# Patient Record
Sex: Female | Born: 1960 | Race: White | Hispanic: No | Marital: Married | Smoking: Current every day smoker
Health system: Southern US, Community
[De-identification: ages and names within clinical notes are randomized; demographics above are authoritative.]

## PROBLEM LIST (undated history)

## (undated) DIAGNOSIS — I10 Essential (primary) hypertension: Secondary | ICD-10-CM

## (undated) DIAGNOSIS — N200 Calculus of kidney: Secondary | ICD-10-CM

## (undated) DIAGNOSIS — R920 Mammographic microcalcification found on diagnostic imaging of breast: Secondary | ICD-10-CM

## (undated) DIAGNOSIS — D0590 Unspecified type of carcinoma in situ of unspecified breast: Secondary | ICD-10-CM

## (undated) DIAGNOSIS — B019 Varicella without complication: Secondary | ICD-10-CM

## (undated) DIAGNOSIS — Z923 Personal history of irradiation: Secondary | ICD-10-CM

## (undated) DIAGNOSIS — Z3200 Encounter for pregnancy test, result unknown: Secondary | ICD-10-CM

## (undated) DIAGNOSIS — B029 Zoster without complications: Secondary | ICD-10-CM

## (undated) HISTORY — DX: Zoster without complications: B02.9

## (undated) HISTORY — DX: Unspecified type of carcinoma in situ of unspecified breast: D05.90

## (undated) HISTORY — DX: Calculus of kidney: N20.0

## (undated) HISTORY — DX: Varicella without complication: B01.9

## (undated) HISTORY — DX: Essential (primary) hypertension: I10

## (undated) HISTORY — DX: Encounter for pregnancy test, result unknown: Z32.00

## (undated) HISTORY — DX: Mammographic microcalcification found on diagnostic imaging of breast: R92.0

---

## 1986-08-30 HISTORY — PX: LAPAROSCOPY: SHX197

## 1990-08-30 HISTORY — PX: LITHOTRIPSY: SUR834

## 2005-08-30 HISTORY — PX: KNEE SURGERY: SHX244

## 2008-07-03 ENCOUNTER — Ambulatory Visit: Payer: Self-pay | Admitting: Unknown Physician Specialty

## 2009-07-16 ENCOUNTER — Ambulatory Visit: Payer: Self-pay | Admitting: Unknown Physician Specialty

## 2011-05-26 ENCOUNTER — Ambulatory Visit: Payer: Self-pay | Admitting: Unknown Physician Specialty

## 2011-05-31 ENCOUNTER — Ambulatory Visit: Payer: Self-pay | Admitting: Unknown Physician Specialty

## 2011-08-31 DIAGNOSIS — N200 Calculus of kidney: Secondary | ICD-10-CM

## 2011-08-31 DIAGNOSIS — R920 Mammographic microcalcification found on diagnostic imaging of breast: Secondary | ICD-10-CM

## 2011-08-31 HISTORY — DX: Mammographic microcalcification found on diagnostic imaging of breast: R92.0

## 2011-08-31 HISTORY — DX: Calculus of kidney: N20.0

## 2012-08-04 ENCOUNTER — Ambulatory Visit: Payer: Self-pay

## 2012-08-30 DIAGNOSIS — Z3200 Encounter for pregnancy test, result unknown: Secondary | ICD-10-CM

## 2012-08-30 DIAGNOSIS — Z923 Personal history of irradiation: Secondary | ICD-10-CM

## 2012-08-30 DIAGNOSIS — D0590 Unspecified type of carcinoma in situ of unspecified breast: Secondary | ICD-10-CM

## 2012-08-30 HISTORY — DX: Personal history of irradiation: Z92.3

## 2012-08-30 HISTORY — PX: BREAST LUMPECTOMY: SHX2

## 2012-08-30 HISTORY — DX: Unspecified type of carcinoma in situ of unspecified breast: D05.90

## 2012-08-30 HISTORY — DX: Encounter for pregnancy test, result unknown: Z32.00

## 2012-09-04 ENCOUNTER — Ambulatory Visit: Payer: Self-pay | Admitting: General Surgery

## 2012-09-13 ENCOUNTER — Ambulatory Visit: Payer: Self-pay | Admitting: General Surgery

## 2012-09-13 HISTORY — PX: BREAST LUMPECTOMY WITH SENTINEL LYMPH NODE BIOPSY: SHX5597

## 2012-09-13 HISTORY — PX: BREAST EXCISIONAL BIOPSY: SUR124

## 2012-09-15 LAB — PATHOLOGY REPORT

## 2012-09-28 ENCOUNTER — Ambulatory Visit: Payer: Self-pay | Admitting: Radiation Oncology

## 2012-09-30 ENCOUNTER — Ambulatory Visit: Payer: Self-pay | Admitting: Radiation Oncology

## 2012-10-13 ENCOUNTER — Ambulatory Visit: Payer: Self-pay | Admitting: Radiation Oncology

## 2012-10-28 ENCOUNTER — Ambulatory Visit: Payer: Self-pay | Admitting: Radiation Oncology

## 2012-11-02 LAB — CBC CANCER CENTER
Basophil %: 0.6 %
Eosinophil #: 0.1 x10 3/mm (ref 0.0–0.7)
HGB: 15.5 g/dL (ref 12.0–16.0)
Lymphocyte #: 1 x10 3/mm (ref 1.0–3.6)
Lymphocyte %: 11.5 %
MCH: 34.1 pg — ABNORMAL HIGH (ref 26.0–34.0)
MCV: 99 fL (ref 80–100)
Monocyte %: 6.8 %
Neutrophil %: 80.2 %
RBC: 4.56 10*6/uL (ref 3.80–5.20)
WBC: 9 x10 3/mm (ref 3.6–11.0)

## 2012-11-09 LAB — CBC CANCER CENTER
Basophil #: 0 x10 3/mm (ref 0.0–0.1)
Eosinophil #: 0 x10 3/mm (ref 0.0–0.7)
Eosinophil %: 0.3 %
HCT: 45.5 % (ref 35.0–47.0)
HGB: 15.8 g/dL (ref 12.0–16.0)
Lymphocyte %: 10.6 %
MCHC: 34.8 g/dL (ref 32.0–36.0)
MCV: 99 fL (ref 80–100)
Monocyte #: 0.7 x10 3/mm (ref 0.2–0.9)
Monocyte %: 7.7 %
Neutrophil #: 7.2 x10 3/mm — ABNORMAL HIGH (ref 1.4–6.5)
Neutrophil %: 81 %
RDW: 12.6 % (ref 11.5–14.5)

## 2012-11-16 LAB — CBC CANCER CENTER
Basophil %: 0.5 %
HCT: 46 % (ref 35.0–47.0)
HGB: 15.7 g/dL (ref 12.0–16.0)
MCV: 99 fL (ref 80–100)
RBC: 4.66 10*6/uL (ref 3.80–5.20)
RDW: 12.5 % (ref 11.5–14.5)
WBC: 7.8 x10 3/mm (ref 3.6–11.0)

## 2012-11-23 ENCOUNTER — Encounter: Payer: Self-pay | Admitting: *Deleted

## 2012-11-23 LAB — CBC CANCER CENTER
Eosinophil %: 0.5 %
HCT: 44.9 % (ref 35.0–47.0)
Lymphocyte %: 11.6 %
MCH: 34 pg (ref 26.0–34.0)
MCV: 99 fL (ref 80–100)
Monocyte #: 0.6 x10 3/mm (ref 0.2–0.9)
Neutrophil #: 5.1 x10 3/mm (ref 1.4–6.5)
Neutrophil %: 78.3 %
WBC: 6.6 x10 3/mm (ref 3.6–11.0)

## 2012-11-28 ENCOUNTER — Ambulatory Visit: Payer: Self-pay | Admitting: Radiation Oncology

## 2012-11-29 ENCOUNTER — Ambulatory Visit: Payer: Self-pay | Admitting: General Surgery

## 2012-11-30 LAB — CBC CANCER CENTER
Basophil #: 0 x10 3/mm (ref 0.0–0.1)
Basophil %: 0.4 %
Eosinophil #: 0.1 x10 3/mm (ref 0.0–0.7)
Eosinophil %: 1.1 %
HCT: 47.4 % — ABNORMAL HIGH (ref 35.0–47.0)
HGB: 16 g/dL (ref 12.0–16.0)
Lymphocyte %: 12.2 %
Lymphs Abs: 0.9 x10 3/mm — ABNORMAL LOW (ref 1.0–3.6)
MCH: 33.4 pg (ref 26.0–34.0)
MCHC: 33.7 g/dL (ref 32.0–36.0)
MCV: 99 fL (ref 80–100)
Monocyte #: 0.7 x10 3/mm (ref 0.2–0.9)
Monocyte %: 8.9 %
Neutrophil #: 5.8 x10 3/mm (ref 1.4–6.5)
Neutrophil %: 77.4 %
Platelet: 260 x10 3/mm (ref 150–440)
RBC: 4.79 x10 6/mm (ref 3.80–5.20)
RDW: 12.7 % (ref 11.5–14.5)
WBC: 7.4 x10 3/mm (ref 3.6–11.0)

## 2012-12-07 LAB — CBC CANCER CENTER
Basophil #: 0 x10 3/mm (ref 0.0–0.1)
Basophil %: 0.5 %
Eosinophil %: 0.5 %
HCT: 46 % (ref 35.0–47.0)
Lymphocyte #: 0.7 x10 3/mm — ABNORMAL LOW (ref 1.0–3.6)
Lymphocyte %: 7.2 %
MCHC: 33.5 g/dL (ref 32.0–36.0)
MCV: 100 fL (ref 80–100)
Monocyte %: 7.8 %
Neutrophil %: 84 %
Platelet: 233 x10 3/mm (ref 150–440)
RDW: 12.5 % (ref 11.5–14.5)
WBC: 9.2 x10 3/mm (ref 3.6–11.0)

## 2012-12-19 ENCOUNTER — Encounter: Payer: Self-pay | Admitting: General Surgery

## 2012-12-19 ENCOUNTER — Ambulatory Visit (INDEPENDENT_AMBULATORY_CARE_PROVIDER_SITE_OTHER): Payer: 59 | Admitting: General Surgery

## 2012-12-19 VITALS — BP 142/100 | HR 88 | Resp 16 | Ht 67.0 in | Wt 140.0 lb

## 2012-12-19 DIAGNOSIS — D0590 Unspecified type of carcinoma in situ of unspecified breast: Secondary | ICD-10-CM

## 2012-12-19 DIAGNOSIS — Z853 Personal history of malignant neoplasm of breast: Secondary | ICD-10-CM

## 2012-12-19 DIAGNOSIS — D0591 Unspecified type of carcinoma in situ of right breast: Secondary | ICD-10-CM

## 2012-12-19 MED ORDER — TAMOXIFEN CITRATE 20 MG PO TABS: 20.0000 mg | ORAL_TABLET | Freq: Every day | ORAL | Status: DC

## 2012-12-19 NOTE — Patient Instructions (Addendum)
Patient to use tamoxifen daily - e  prescription to pharmacy.   The patient has been asked to return to the office in 3 months with a unilateral right breast diagnostic mammogram.

## 2012-12-19 NOTE — Progress Notes (Signed)
   Patient ID: Jane Gonzales, female   DOB: 1960-09-24, 52 y.o.   MRN: [id number]  Chief Complaint  Patient presents with  . Follow-up    breast ca    HPI Jane Gonzales is a 52 y.o. female here today for her follow up  right breast  Lumpectomy with sentinel  nodule done 08/2012.Finish radiation on 12/14/2012  HPI  Past Medical History  Diagnosis Date  . Kidney stones 2013  . Mammographic microcalcification 2013  . 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 Pregnancy examination or test, pregnancy unconfirmed 2014    Past Surgical History  Procedure Laterality Date  . Breast lumpectomy with sentinel lymph node biopsy Right 09/13/2012  . Knee surgery  2007    ACL  . Laparoscopy  1988  . Lithotripsy  1992    History reviewed. No pertinent family history.  Social History History  Substance Use Topics  . Smoking status: Current Every Day Smoker -- 0.50 packs/day for 28 years    Types: Cigarettes  . Smokeless tobacco: Never Used  . Alcohol Use: 3.6 oz/week    6 Glasses of wine per week     Comment: beer or wine    No Known Allergies  Current Outpatient Prescriptions  Medication Sig Dispense Refill  . hydrochlorothiazide (HYDRODIURIL) 25 MG tablet Take 25 mg by mouth daily. Start on 10/02/2012 and complete on 11/30/2012      . tamoxifen (NOLVADEX) 20 MG tablet Take 1 tablet (20 mg total) by mouth daily.  30 tablet  12   No current facility-administered medications for this visit.    Review of Systems Review of Systems  Constitutional: Negative.   Respiratory: Negative.   Cardiovascular: Negative.     Blood pressure 142/100, pulse 88, resp. rate 16, height 5\' 7"  (1.702 m), weight 140 lb (63.504 kg), last menstrual period 12/17/2012.  Physical Exam Physical Exam  Constitutional: She appears well-developed.  Cardiovascular: Normal rate, regular rhythm and normal heart sounds.   Pulmonary/Chest: Effort normal  and breath sounds normal. Right breast exhibits no inverted nipple, no mass, no nipple discharge, no skin change and no tenderness. Left breast exhibits no inverted nipple, no mass, no nipple discharge, no skin change and no tenderness.  Post radiation skin changes  Lymphadenopathy:    She has no cervical adenopathy.    She has no axillary adenopathy.    Data Reviewed none  Assessment    DCIS right breast     Plan    Start on Tamoxifen. Risks and benefits explained to pt.      The patient has been asked to return to the office in 3 months for a unilateral right breast diagnostic mammogram.   Ariya Bohannon G 12/19/2012, 11:23 AM

## 2012-12-28 ENCOUNTER — Ambulatory Visit: Payer: Self-pay | Admitting: Radiation Oncology

## 2013-03-22 ENCOUNTER — Ambulatory Visit: Payer: Self-pay | Admitting: General Surgery

## 2013-03-29 ENCOUNTER — Ambulatory Visit (INDEPENDENT_AMBULATORY_CARE_PROVIDER_SITE_OTHER): Payer: 59 | Admitting: General Surgery

## 2013-03-29 ENCOUNTER — Encounter: Payer: Self-pay | Admitting: General Surgery

## 2013-03-29 VITALS — BP 130/84 | HR 86 | Resp 14 | Ht 67.0 in | Wt 134.0 lb

## 2013-03-29 DIAGNOSIS — R232 Flushing: Secondary | ICD-10-CM | POA: Insufficient documentation

## 2013-03-29 DIAGNOSIS — Z853 Personal history of malignant neoplasm of breast: Secondary | ICD-10-CM

## 2013-03-29 DIAGNOSIS — N951 Menopausal and female climacteric states: Secondary | ICD-10-CM

## 2013-03-29 MED ORDER — VENLAFAXINE HCL ER 37.5 MG PO CP24: 37.5000 mg | ORAL_CAPSULE | Freq: Every day | ORAL | Status: DC

## 2013-03-29 NOTE — Progress Notes (Signed)
Patient ID: Jane Gonzales, female   DOB: 1961-05-02, 52 y.o.   MRN: [id number]  Chief Complaint  Patient presents with  . Follow-up    mammogram    HPI Jane Gonzales is a 51 y.o. female who presents for a breast evaluation. The most recent mammogram was done on 03/22/13 cat 3. Patient does perform regular self breast checks and gets regular mammograms done. Patient is having a lot of hot flashes-from Tamoxifen  HPI  Past Medical History  Diagnosis Date  . Kidney stones 2013  . Mammographic microcalcification 2013  . 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 Pregnancy examination or test, pregnancy unconfirmed 2014    Past Surgical History  Procedure Laterality Date  . Breast lumpectomy with sentinel lymph node biopsy Right 09/13/2012  . Knee surgery  2007    ACL  . Laparoscopy  1988  . Lithotripsy  1992    History reviewed. No pertinent family history.  Social History History  Substance Use Topics  . Smoking status: Current Every Day Smoker -- 0.50 packs/day for 28 years    Types: Cigarettes  . Smokeless tobacco: Never Used  . Alcohol Use: 3.6 oz/week    6 Glasses of wine per week     Comment: beer or wine    No Known Allergies  Current Outpatient Prescriptions  Medication Sig Dispense Refill  . hydrochlorothiazide (HYDRODIURIL) 25 MG tablet Take 25 mg by mouth daily. Start on 10/02/2012 and complete on 11/30/2012      . lisinopril (PRINIVIL,ZESTRIL) 20 MG tablet Take 1 tablet by mouth daily.      . tamoxifen (NOLVADEX) 20 MG tablet Take 1 tablet (20 mg total) by mouth daily.  30 tablet  12  . venlafaxine XR (EFFEXOR XR) 37.5 MG 24 hr capsule Take 1 capsule (37.5 mg total) by mouth daily.  1 capsule  3   No current facility-administered medications for this visit.    Review of Systems Review of Systems  Constitutional: Negative.   Respiratory: Negative.   Cardiovascular: Negative.     Blood pressure  130/84, pulse 86, resp. rate 14, height 5\' 7"  (1.702 m), weight 134 lb (60.782 kg), last menstrual period 03/15/2013.  Physical Exam Physical Exam  Constitutional: She is oriented to person, place, and time. She appears well-developed and well-nourished.  Eyes: Conjunctivae are normal. No scleral icterus.  Neck: Neck supple. No mass and no thyromegaly present.  Cardiovascular: Normal rate, regular rhythm and normal heart sounds.   Pulses:      Dorsalis pedis pulses are 2+ on the right side, and 2+ on the left side.       Posterior tibial pulses are 2+ on the right side, and 2+ on the left side.  No edema, no VV  Pulmonary/Chest: Effort normal and breath sounds normal. Right breast exhibits no inverted nipple, no mass, no nipple discharge, no skin change and no tenderness. Left breast exhibits no inverted nipple, no mass, no nipple discharge, no skin change and no tenderness. Breasts are symmetrical.  Right breast lumpectomy site is well healed. Minimal skin color from radiations.   Abdominal: Soft. Bowel sounds are normal. There is no hepatosplenomegaly. There is no tenderness.  Lymphadenopathy:    She has no cervical adenopathy.    She has no axillary adenopathy.  Neurological: She is alert and oriented to person, place, and time.  Skin: Skin is warm and dry.  Data Reviewed mammogram reviewed  Assessment    Stable exam        Plan    Patient to return in six months bilateral mammograms.Dicuss use of  Effexor for control of hot flashes. Patient willing to try.       Ahnyla Mendel G 03/29/2013, 11:44 AM

## 2013-03-29 NOTE — Patient Instructions (Addendum)
Patient to return in six months with bilateral mammograms.

## 2013-03-30 ENCOUNTER — Encounter: Payer: Self-pay | Admitting: General Surgery

## 2013-04-19 ENCOUNTER — Telehealth: Payer: Self-pay

## 2013-04-19 NOTE — Telephone Encounter (Signed)
Called concerned about her period being late this month and only having some very light spotting. She has been on Tamoxifen since April.

## 2013-04-19 NOTE — Telephone Encounter (Signed)
I told her that is possible for periods to be affected.  She states that she is not worried about getting pregnant since her husband sperm count is zero.  She will keep track of her periods and monitor.  I told her I would let Dr Evette Cristal know on Monday and that if there was anything to do different I would let her know.

## 2013-06-08 ENCOUNTER — Ambulatory Visit: Payer: Self-pay | Admitting: Radiation Oncology

## 2013-06-30 ENCOUNTER — Ambulatory Visit: Payer: Self-pay | Admitting: Radiation Oncology

## 2013-07-05 ENCOUNTER — Other Ambulatory Visit: Payer: Self-pay

## 2013-09-17 ENCOUNTER — Ambulatory Visit: Payer: Self-pay | Admitting: General Surgery

## 2013-09-18 ENCOUNTER — Encounter: Payer: Self-pay | Admitting: General Surgery

## 2013-09-25 ENCOUNTER — Ambulatory Visit (INDEPENDENT_AMBULATORY_CARE_PROVIDER_SITE_OTHER): Payer: 59 | Admitting: General Surgery

## 2013-09-25 ENCOUNTER — Encounter: Payer: Self-pay | Admitting: General Surgery

## 2013-09-25 VITALS — BP 130/74 | HR 76 | Resp 14 | Ht 67.0 in | Wt 132.0 lb

## 2013-09-25 DIAGNOSIS — Z853 Personal history of malignant neoplasm of breast: Secondary | ICD-10-CM

## 2013-09-25 DIAGNOSIS — D0590 Unspecified type of carcinoma in situ of unspecified breast: Secondary | ICD-10-CM

## 2013-09-25 DIAGNOSIS — D0510 Intraductal carcinoma in situ of unspecified breast: Secondary | ICD-10-CM | POA: Insufficient documentation

## 2013-09-25 MED ORDER — TAMOXIFEN CITRATE 20 MG PO TABS: 20.0000 mg | ORAL_TABLET | Freq: Every day | ORAL | Status: DC

## 2013-09-25 NOTE — Progress Notes (Signed)
Patient ID: Jane Gonzales, female   DOB: 1960-12-20, 53 y.o.   MRN: [id number]  Chief Complaint  Patient presents with  . Follow-up    mammogram    HPI Jane Gonzales is a 53 y.o. female. who presents for a breast evaluation. The most recent mammogram was done on 09/17/13.Patient does perform regular self breast checks and gets regular mammograms done.  Patient states she is still on the Tamoxifen. She continues with some hot flashes -seems to be tolerating it ok. Not using Effexor that was prescribed last visit.  HPI  Past Medical History  Diagnosis Date  . Kidney stones 2013  . Mammographic microcalcification 2013  . 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 Pregnancy examination or test, pregnancy unconfirmed 2014    Past Surgical History  Procedure Laterality Date  . Breast lumpectomy with sentinel lymph node biopsy Right 09/13/2012  . Knee surgery  2007    ACL  . Laparoscopy  1988  . Lithotripsy  1992    History reviewed. No pertinent family history.  Social History History  Substance Use Topics  . Smoking status: Current Every Day Smoker -- 0.50 packs/day for 28 years    Types: Cigarettes  . Smokeless tobacco: Never Used  . Alcohol Use: 3.6 oz/week    6 Glasses of wine per week     Comment: beer or wine    No Known Allergies  Current Outpatient Prescriptions  Medication Sig Dispense Refill  . Ascorbic Acid (VITAMIN C) 100 MG tablet Take 100 mg by mouth daily.      Marland Kitchen lisinopril (PRINIVIL,ZESTRIL) 20 MG tablet Take 1 tablet by mouth daily.      . tamoxifen (NOLVADEX) 20 MG tablet Take 1 tablet (20 mg total) by mouth daily.  30 tablet  12  . tamoxifen (NOLVADEX) 20 MG tablet Take 1 tablet (20 mg total) by mouth daily.  90 tablet  4   No current facility-administered medications for this visit.    Review of Systems Review of Systems  Constitutional: Negative.   Respiratory: Negative.    Cardiovascular: Negative.     Blood pressure 130/74, pulse 76, resp. rate 14, height 5\' 7"  (1.702 m), weight 132 lb (59.875 kg).  Physical Exam Physical Exam  Constitutional: She is oriented to person, place, and time. She appears well-developed and well-nourished.  Eyes: Conjunctivae are normal. No scleral icterus.  Neck: Neck supple.  Cardiovascular: Normal rate, regular rhythm, normal heart sounds, intact distal pulses and normal pulses.   Pulses:      Dorsalis pedis pulses are 2+ on the right side, and 2+ on the left side.       Posterior tibial pulses are 2+ on the right side, and 2+ on the left side.  No edema, no vv  Pulmonary/Chest: Effort normal and breath sounds normal. She exhibits no swelling. Right breast exhibits no inverted nipple, no mass, no nipple discharge, no skin change and no tenderness. Left breast exhibits no inverted nipple, no mass, no nipple discharge, no skin change and no tenderness.  Right breast firmness 9 o'clock  Abdominal: Soft. Normal appearance and bowel sounds are normal. There is no hepatomegaly. There is no tenderness. No hernia.  Lymphadenopathy:    She has no cervical adenopathy.    She has no axillary adenopathy.  Neurological: She is alert and oriented to person, place, and time.  Skin: Skin is dry.  Data Reviewed Mammogram reviewed-stable post surgical changes.  Assessment    Stable exam. Righ breast DCIS, Er/PR pos. On Tamoxifen. Now completing 1 yr.    Plan    Patient to return in six months with right breast diagnotic mammogram.       [name] 09/25/2013, 10:50 AM

## 2013-09-25 NOTE — Patient Instructions (Signed)
Patient to return in six months Right breast diagnotic mammogram.

## 2013-10-10 ENCOUNTER — Encounter: Payer: Self-pay | Admitting: General Surgery

## 2013-12-05 ENCOUNTER — Ambulatory Visit: Payer: Self-pay | Admitting: Radiation Oncology

## 2013-12-18 ENCOUNTER — Telehealth: Payer: Self-pay | Admitting: *Deleted

## 2013-12-18 NOTE — Telephone Encounter (Signed)
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 Just let me know when this is ready and I will call her to pick up!

## 2013-12-19 ENCOUNTER — Encounter: Payer: Self-pay | Admitting: *Deleted

## 2013-12-20 NOTE — Telephone Encounter (Signed)
Spoke with patient and let her know that her letter was ready. She asked that it be faxed to her at [phone] and also mailed to her at her home address. Her letter has been faxed and mailed.

## 2013-12-28 ENCOUNTER — Ambulatory Visit: Payer: Self-pay | Admitting: Radiation Oncology

## 2014-01-28 DIAGNOSIS — B029 Zoster without complications: Secondary | ICD-10-CM

## 2014-01-28 HISTORY — DX: Zoster without complications: B02.9

## 2014-02-18 ENCOUNTER — Encounter: Payer: Self-pay | Admitting: Internal Medicine

## 2014-02-18 ENCOUNTER — Ambulatory Visit (INDEPENDENT_AMBULATORY_CARE_PROVIDER_SITE_OTHER): Payer: 59 | Admitting: Internal Medicine

## 2014-02-18 VITALS — BP 126/68 | HR 98 | Temp 98.3°F | Ht 64.5 in | Wt 133.0 lb

## 2014-02-18 DIAGNOSIS — I10 Essential (primary) hypertension: Secondary | ICD-10-CM | POA: Insufficient documentation

## 2014-02-18 DIAGNOSIS — B029 Zoster without complications: Secondary | ICD-10-CM

## 2014-02-18 DIAGNOSIS — Z0000 Encounter for general adult medical examination without abnormal findings: Secondary | ICD-10-CM

## 2014-02-18 MED ORDER — LISINOPRIL 20 MG PO TABS: 20.0000 mg | ORAL_TABLET | Freq: Every day | ORAL | Status: DC

## 2014-02-18 NOTE — Addendum Note (Signed)
Addended by: Alvina ChouWALSH, TERRI J on: 02/18/2014 02:12 PM   Modules accepted: Orders

## 2014-02-18 NOTE — Patient Instructions (Addendum)

## 2014-02-18 NOTE — Assessment & Plan Note (Signed)
Well controlled on lisinopril RX refilled today

## 2014-02-18 NOTE — Progress Notes (Signed)
HPI  Pt presents to the clinic today to establish care. She is transferring care from [place]. Blond[place] She has no concerns today.  Flu: 05/2013 Tetanus: more than 10 years ago LMP: postmenopausal Pap Smear: 05/2013- normal Mammogram: 08/2013- normal Eye Doctor: yearly Dentist: as needed  Past Medical History  Diagnosis Date  . Kidney stones 2013  . Mammographic microcalcification 2013  . 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 Pregnancy examination or test, pregnancy unconfirmed 2014  . Chicken pox     Current Outpatient Prescriptions  Medication Sig Dispense Refill  . Ascorbic Acid (VITAMIN C) 100 MG tablet Take 100 mg by mouth daily.      Marland Kitchen. lisinopril (PRINIVIL,ZESTRIL) 20 MG tablet Take 1 tablet by mouth daily.      . tamoxifen (NOLVADEX) 20 MG tablet Take 1 tablet (20 mg total) by mouth daily.  30 tablet  12   No current facility-administered medications for this visit.    No Known Allergies  Family History  Problem Relation Age of Onset  . Hyperlipidemia Father   . Heart disease Father   . Hypertension Father   . Stroke Paternal Grandmother     History   Social History  . Marital Status: Married    Spouse Name: N/A    Number of Children: N/A  . Years of Education: N/A   Occupational History  . Not on file.   Social History Main Topics  . Smoking status: Current Every Day Smoker -- 0.50 packs/day for 28 years    Types: Cigarettes  . Smokeless tobacco: Never Used  . Alcohol Use: 3.6 oz/week    6 Glasses of wine per week     Comment: beer or wine  . Drug Use: No  . Sexual Activity: Not on file   Other Topics Concern  . Not on file   Social History Narrative  . No narrative on file    ROS:  Constitutional: Denies fever, malaise, fatigue, headache or abrupt weight changes.  HEENT: Denies eye pain, eye redness, ear pain, ringing in the ears, wax buildup, runny nose,  nasal congestion, bloody nose, or sore throat. Respiratory: Denies difficulty breathing, shortness of breath, cough or sputum production.   Cardiovascular: Denies chest pain, chest tightness, palpitations or swelling in the hands or feet.  Gastrointestinal: Denies abdominal pain, bloating, constipation, diarrhea or blood in the stool.  GU: Denies frequency, urgency, pain with urination, blood in urine, odor or discharge. Musculoskeletal: Denies decrease in range of motion, difficulty with gait, muscle pain or joint pain and swelling.  Skin: Pt reports rash. Denies  ulcercations.  Neurological: Denies dizziness, difficulty with memory, difficulty with speech or problems with balance and coordination.   No other specific complaints in a complete review of systems (except as listed in HPI above).  PE:  BP 126/68  Pulse 98  Temp(Src) 98.3 F (36.8 C) (Oral)  Ht 5' 4.5" (1.638 m)  Wt 133 lb (60.328 kg)  BMI 22.48 kg/m2 Wt Readings from Last 3 Encounters:  02/18/14 133 lb (60.328 kg)  09/25/13 132 lb (59.875 kg)  03/29/13 134 lb (60.782 kg)    General: Appears her stated age, well developed, well nourished in NAD. Skin: Small grouped vesicular lesions, dried on right scapula/left breast. HEENT: Head: normal shape and size; Eyes: sclera white, no icterus, conjunctiva pink, PERRLA and EOMs intact; Ears: Tm's gray and intact, normal light  reflex; Nose: mucosa pink and moist, septum midline; Throat/Mouth: Teeth present, mucosa pink and moist, no lesions or ulcerations noted.  Neck: Normal range of motion. Neck supple, trachea midline. No massses, lumps or thyromegaly present.  Cardiovascular: Normal rate and rhythm. S1,S2 noted.  No murmur, rubs or gallops noted. No JVD or BLE edema. No carotid bruits noted. Pulmonary/Chest: Normal effort and positive vesicular breath sounds. No respiratory distress. No wheezes, rales or ronchi noted.  Abdomen: Soft and nontender. Normal bowel sounds, no bruits  noted. No distention or masses noted. Liver, spleen and kidneys non palpable. Musculoskeletal: Normal range of motion. No signs of joint swelling. No difficulty with gait.  Neurological: Alert and oriented. Cranial nerves II-XII intact. Coordination normal. +DTRs bilaterally. Psychiatric: Mood and affect normal. Behavior is normal. Judgment and thought content normal.      Assessment and Plan:  Preventative Health Maintenance:  Offered Tdap today- she declines Will obtain screening blood work today Discussed smoking cessation, she declines  Shingles:  Appears to be resolving Continue to monitor  RTC in 1 year or sooner if needed

## 2014-02-18 NOTE — Progress Notes (Signed)
Pre visit review using our clinic review tool, if applicable. No additional management support is needed unless otherwise documented below in the visit note. 

## 2014-02-19 ENCOUNTER — Telehealth: Payer: Self-pay | Admitting: Internal Medicine

## 2014-02-19 LAB — CBC WITH DIFFERENTIAL
Basophils Absolute: 0 10*3/uL (ref 0.0–0.2)
Basos: 0 %
EOS ABS: 0.1 10*3/uL (ref 0.0–0.4)
Eos: 1 %
HCT: 41.1 % (ref 34.0–46.6)
Hemoglobin: 14.1 g/dL (ref 11.1–15.9)
IMMATURE GRANS (ABS): 0 10*3/uL (ref 0.0–0.1)
IMMATURE GRANULOCYTES: 0 %
Lymphocytes Absolute: 2.1 10*3/uL (ref 0.7–3.1)
Lymphs: 32 %
MCH: 33.7 pg — AB (ref 26.6–33.0)
MCHC: 34.3 g/dL (ref 31.5–35.7)
MCV: 98 fL — ABNORMAL HIGH (ref 79–97)
MONOS ABS: 0.6 10*3/uL (ref 0.1–0.9)
Monocytes: 9 %
NEUTROS PCT: 58 %
Neutrophils Absolute: 3.9 10*3/uL (ref 1.4–7.0)
Platelets: 305 10*3/uL (ref 150–379)
RBC: 4.19 x10E6/uL (ref 3.77–5.28)
RDW: 13.1 % (ref 12.3–15.4)
WBC: 6.8 10*3/uL (ref 3.4–10.8)

## 2014-02-19 LAB — COMPREHENSIVE METABOLIC PANEL
ALT: 14 IU/L (ref 0–32)
AST: 19 IU/L (ref 0–40)
Albumin/Globulin Ratio: 2.1 (ref 1.1–2.5)
Albumin: 4.6 g/dL (ref 3.5–5.5)
Alkaline Phosphatase: 64 IU/L (ref 39–117)
BUN/Creatinine Ratio: 13 (ref 9–23)
BUN: 10 mg/dL (ref 6–24)
CALCIUM: 10.1 mg/dL (ref 8.7–10.2)
CO2: 20 mmol/L (ref 18–29)
Chloride: 100 mmol/L (ref 97–108)
Creatinine, Ser: 0.79 mg/dL (ref 0.57–1.00)
GFR calc Af Amer: 100 mL/min/{1.73_m2} (ref >59)
GFR calc non Af Amer: 87 mL/min/{1.73_m2} (ref >59)
Globulin, Total: 2.2 g/dL (ref 1.5–4.5)
Glucose: 118 mg/dL — ABNORMAL HIGH (ref 65–99)
POTASSIUM: 4.6 mmol/L (ref 3.5–5.2)
SODIUM: 139 mmol/L (ref 134–144)
Total Bilirubin: 0.4 mg/dL (ref 0.0–1.2)
Total Protein: 6.8 g/dL (ref 6.0–8.5)

## 2014-02-19 LAB — LIPID PANEL
Chol/HDL Ratio: 2.7 ratio units (ref 0.0–4.4)
Cholesterol, Total: 141 mg/dL (ref 100–199)
HDL: 52 mg/dL (ref >39)
LDL Calculated: 56 mg/dL (ref 0–99)
TRIGLYCERIDES: 164 mg/dL — AB (ref 0–149)
VLDL Cholesterol Cal: 33 mg/dL (ref 5–40)

## 2014-02-19 NOTE — Telephone Encounter (Signed)
Relevant patient education mailed to patient.  

## 2014-02-19 NOTE — Telephone Encounter (Signed)
Relevant patient education assigned to patient using Emmi. ° °

## 2014-04-08 ENCOUNTER — Encounter: Payer: Self-pay | Admitting: General Surgery

## 2014-04-09 ENCOUNTER — Ambulatory Visit (INDEPENDENT_AMBULATORY_CARE_PROVIDER_SITE_OTHER): Payer: 59 | Admitting: General Surgery

## 2014-04-09 ENCOUNTER — Encounter: Payer: Self-pay | Admitting: General Surgery

## 2014-04-09 VITALS — BP 130/80 | HR 82 | Resp 14 | Ht 66.5 in | Wt 135.0 lb

## 2014-04-09 DIAGNOSIS — D0590 Unspecified type of carcinoma in situ of unspecified breast: Secondary | ICD-10-CM

## 2014-04-09 DIAGNOSIS — D0511 Intraductal carcinoma in situ of right breast: Secondary | ICD-10-CM

## 2014-04-09 IMAGING — CR DG OUTSIDE FILMS BODY
7 of 8 series · 7 of 8 positions shown · non-contrast
Comparison: none

[LM (1 of 7)]
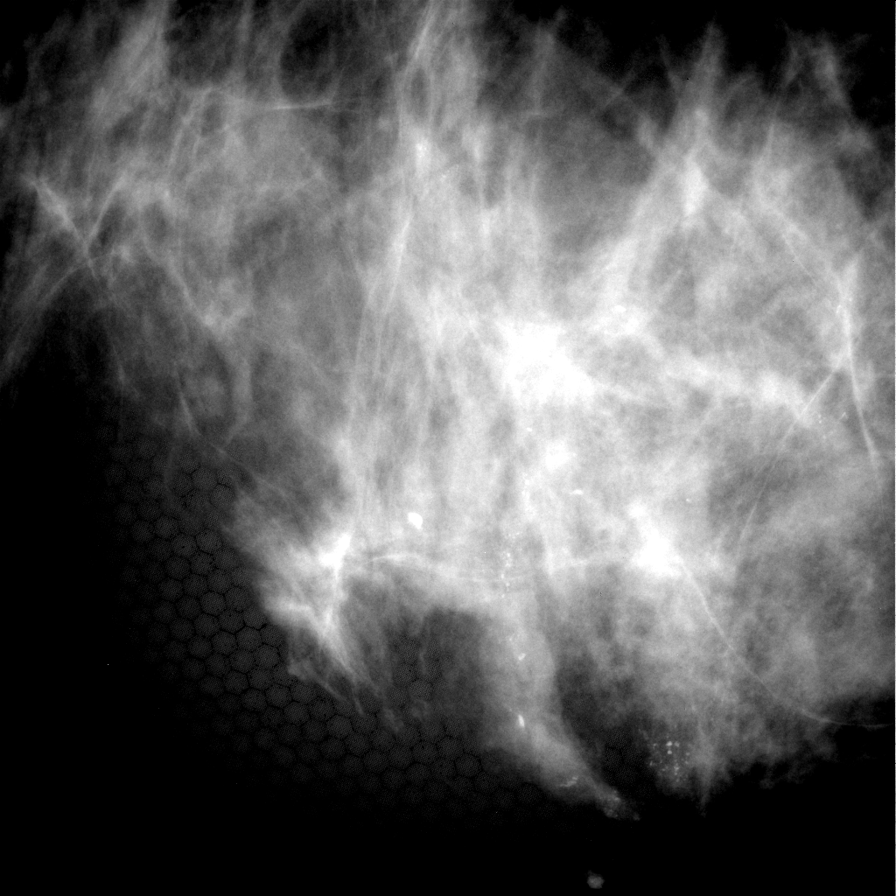

[LM (2 of 7)]
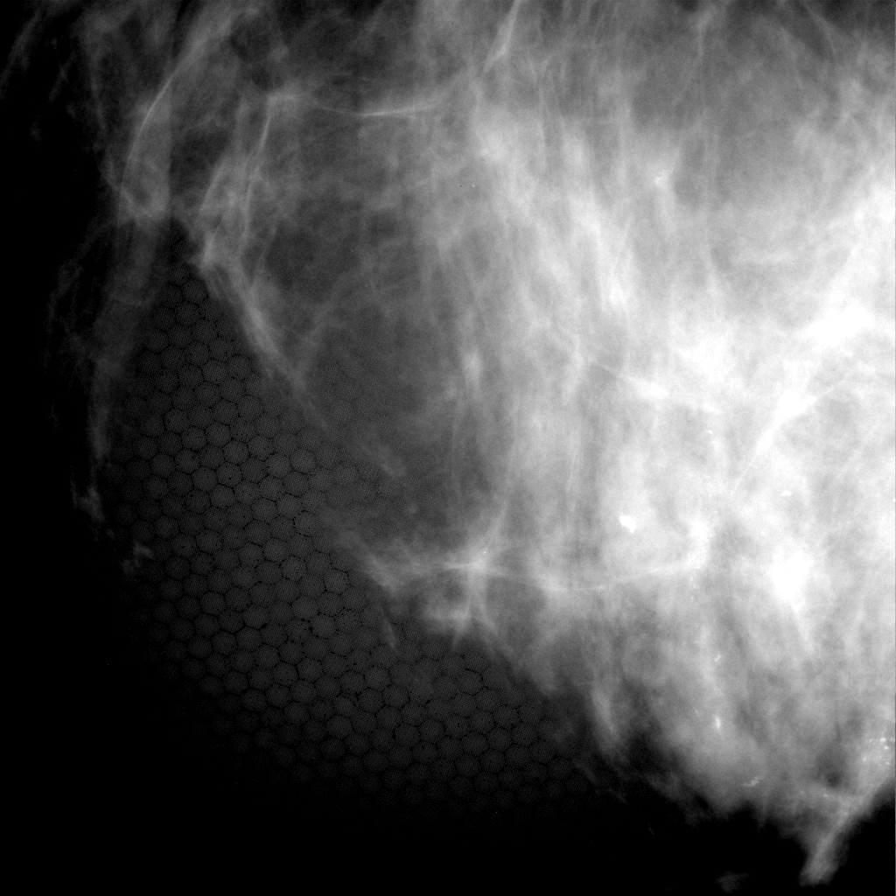

[LM (3 of 7)]
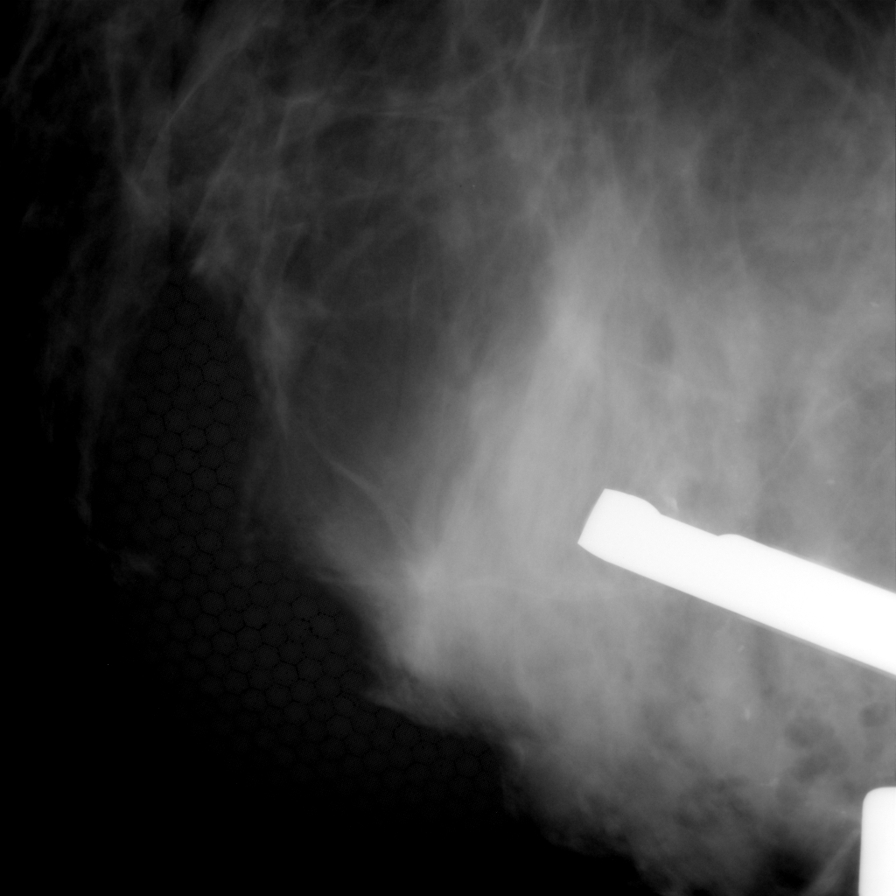

[LM (4 of 7)]
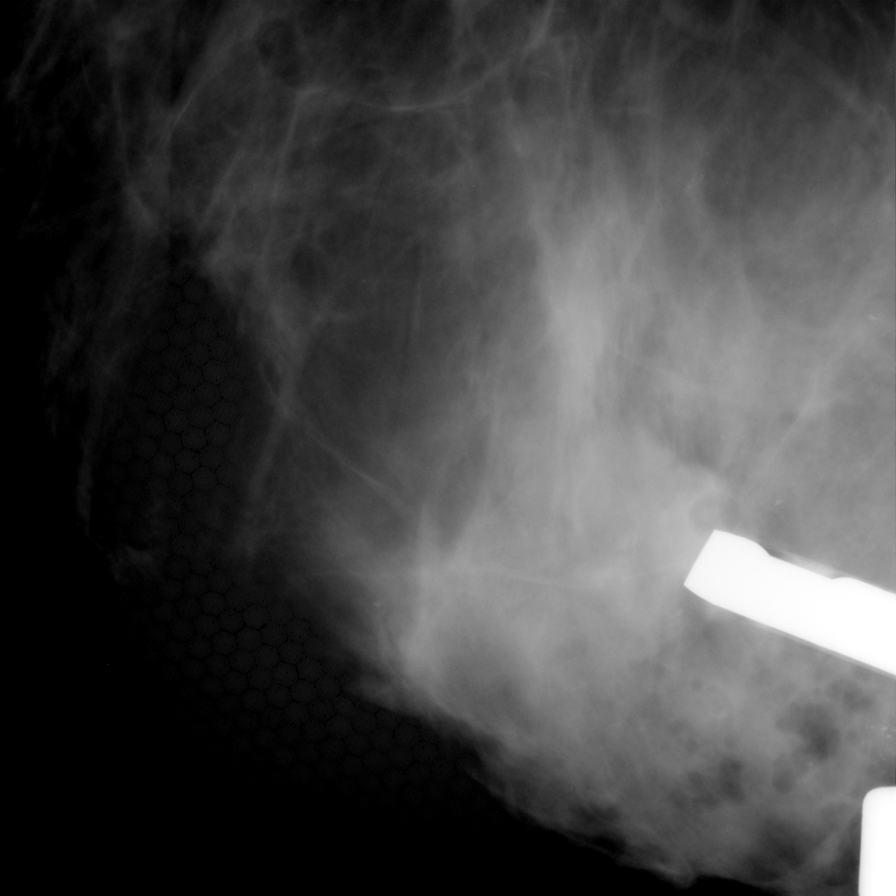

[LM (5 of 7)]
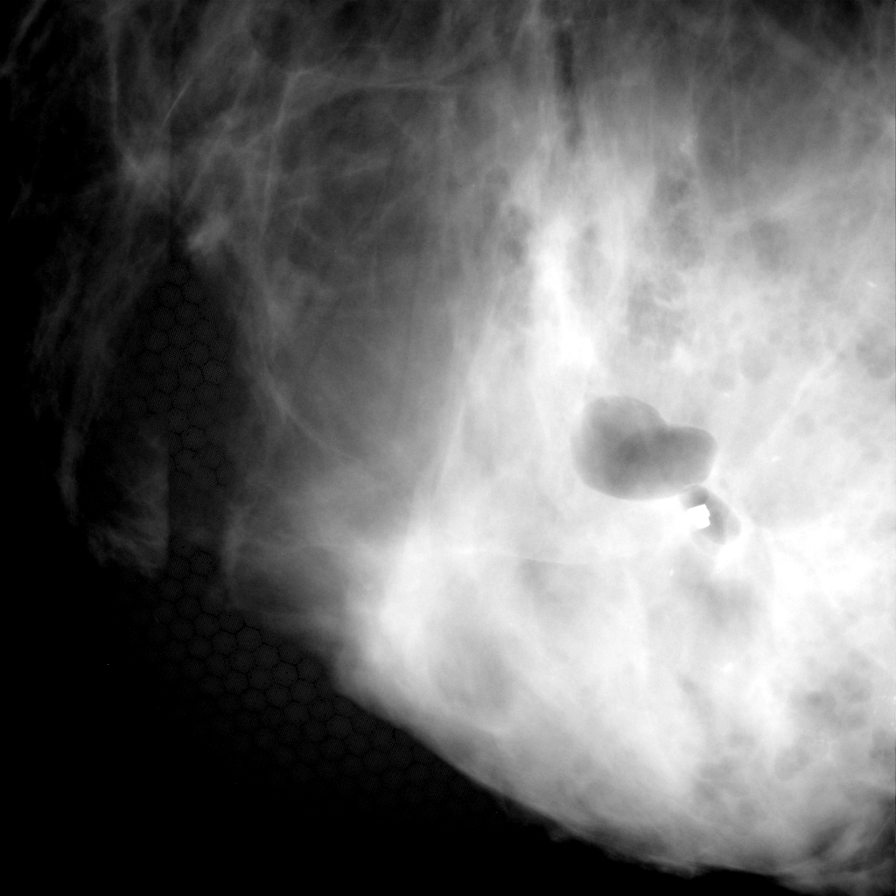

[LM (6 of 7)]
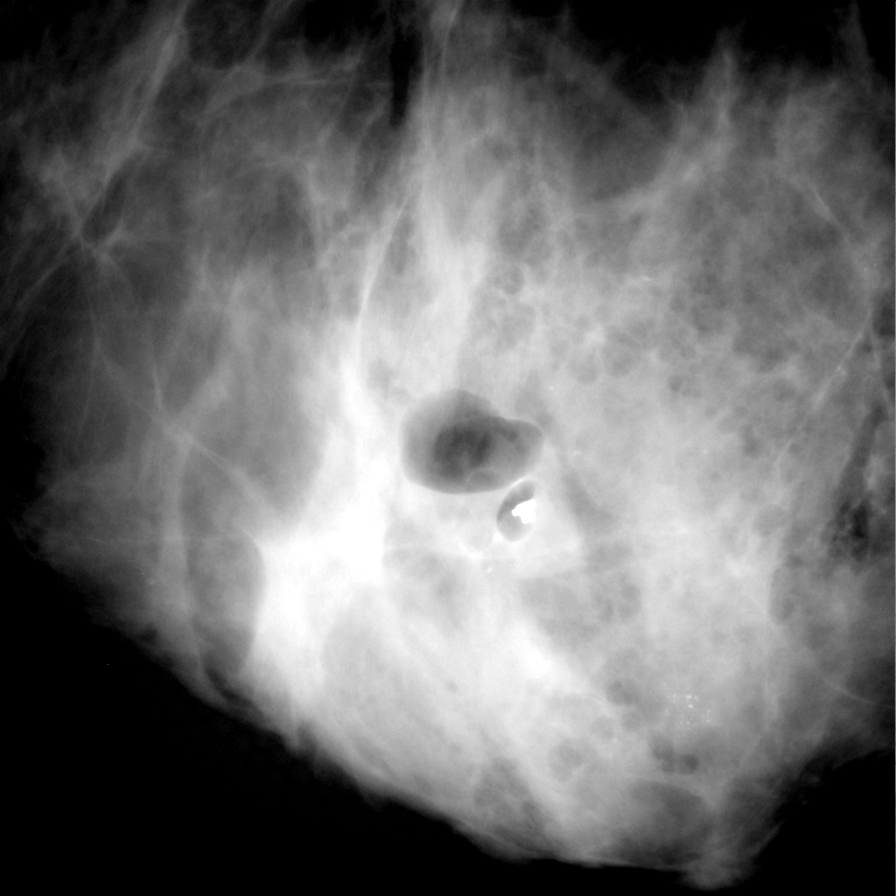

[LM (7 of 7)]
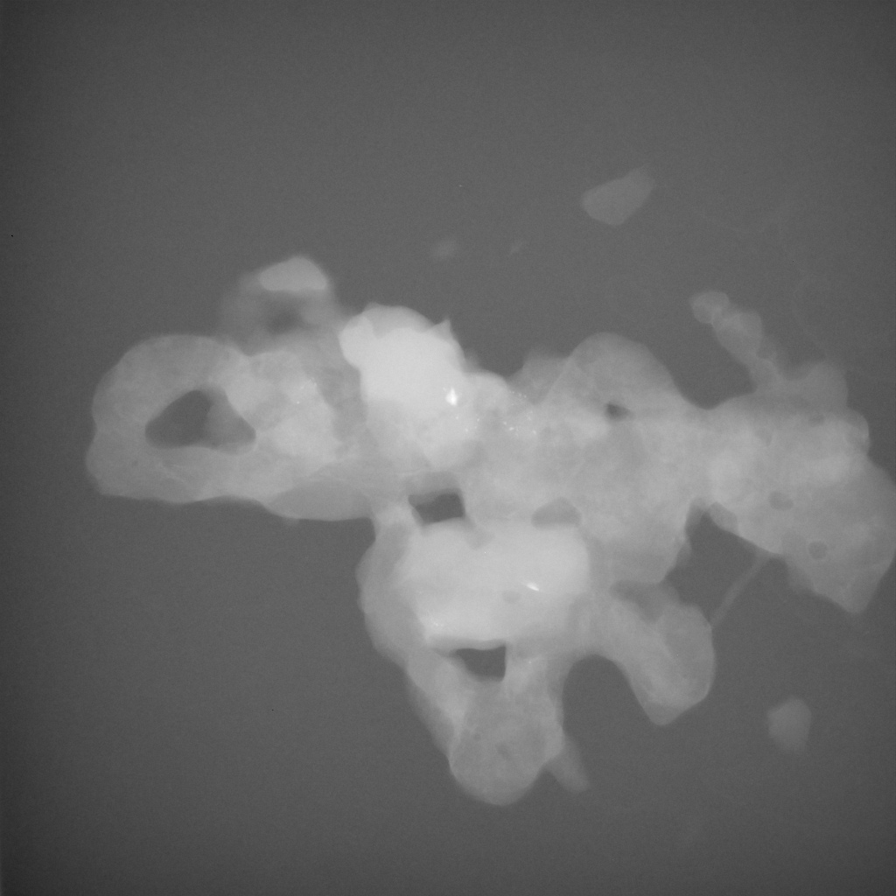

[7 of 8 positions shown; findings below may reference images not displayed]

Canned report from images found in remote index.

Refer to host system for actual result text.

## 2014-04-09 NOTE — Progress Notes (Signed)
Patient ID: Jane RyderStacia R Gonzales, female   DOB: 09-08-1960, 53 y.o.   MRN: [id number]  Chief Complaint  Patient presents with  . Follow-up    right mammogram 03-21-14    HPI Jane Gonzales is a 53 y.o. female.  who presents for a breast cancer follow up. The most recent mammogram was done on 03-21-14.  Patient does perform regular self breast checks and gets regular mammograms done.   Patient states she is still on the Tamoxifen. She continues with some hot flashes -seems to be tolerating it ok. No new breast issues. The shingles she had was localized to the right breast and flank area.   HPI  Past Medical History  Diagnosis Date  . Kidney stones 2013  . Mammographic microcalcification 2013  . 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 Pregnancy examination or test, pregnancy unconfirmed 2014  . Chicken pox   . Shingles June 2015    Past Surgical History  Procedure Laterality Date  . Breast lumpectomy with sentinel lymph node biopsy Right 09/13/2012  . Knee surgery  2007    ACL  . Laparoscopy  1988  . Lithotripsy  1992    Family History  Problem Relation Age of Onset  . Hyperlipidemia Father   . Heart disease Father   . Hypertension Father   . Stroke Paternal Grandmother   . Cancer Neg Hx     Social History History  Substance Use Topics  . Smoking status: Current Every Day Smoker -- 0.50 packs/day for 28 years    Types: Cigarettes  . Smokeless tobacco: Never Used  . Alcohol Use: 3.6 oz/week    6 Glasses of wine per week     Comment: beer or wine    No Known Allergies  Current Outpatient Prescriptions  Medication Sig Dispense Refill  . Ascorbic Acid (VITAMIN C) 100 MG tablet Take 100 mg by mouth daily.      . tamoxifen (NOLVADEX) 20 MG tablet Take 1 tablet (20 mg total) by mouth daily.  30 tablet  12  . lisinopril (PRINIVIL,ZESTRIL) 20 MG tablet Take 1 tablet (20 mg total) by mouth daily.  90 tablet  3    No current facility-administered medications for this visit.    Review of Systems Review of Systems  Blood pressure 130/80, pulse 82, resp. rate 14, height 5' 6.5" (1.689 m), weight 135 lb (61.236 kg).  Physical Exam Physical Exam  Constitutional: She is oriented to person, place, and time. She appears well-developed and well-nourished.  Eyes: Conjunctivae are normal. No scleral icterus.  Neck: Neck supple.  Cardiovascular: Normal rate, regular rhythm and normal heart sounds.   No lower leg edema.  Pulmonary/Chest: Effort normal and breath sounds normal. Right breast exhibits no inverted nipple, no mass, no nipple discharge, no skin change and no tenderness. Left breast exhibits no inverted nipple, no mass, no nipple discharge, no skin change and no tenderness.  Abdominal: Soft. Normal appearance. There is no hepatosplenomegaly. There is no tenderness.  Lymphadenopathy:    She has no cervical adenopathy.    She has no axillary adenopathy.  Neurological: She is alert and oriented to person, place, and time.  Skin: Skin is warm and dry.    Data Reviewed Mammogram reviewed and stable.  Assessment    Stable physical exam. Right breast DCIS, s/p lumpectomy and radiation, now on Tamoxifen.Now 18 months out.  Plan    Follow up in 6 months bilateral diagnostic mammogram and office visit.       Jane Gonzales G 04/10/2014, 6:02 AM

## 2014-04-09 NOTE — Patient Instructions (Addendum)
Continue self breast exams. Call office for any new breast issues or concerns. Follow up in 6 months bilateral diagnostic mammogram and office visit. 

## 2014-04-10 ENCOUNTER — Encounter: Payer: Self-pay | Admitting: General Surgery

## 2014-07-01 ENCOUNTER — Encounter: Payer: Self-pay | Admitting: General Surgery

## 2014-09-09 ENCOUNTER — Telehealth: Payer: Self-pay

## 2014-09-09 NOTE — Telephone Encounter (Signed)
Pt request to pick up today 02/18/14 lab results at [facility]. Advised pt ready for pick up at front desk.

## 2014-09-13 HISTORY — PX: BREAST EXCISIONAL BIOPSY: SUR124

## 2014-10-10 ENCOUNTER — Encounter: Payer: Self-pay | Admitting: General Surgery

## 2014-10-15 ENCOUNTER — Encounter: Payer: Self-pay | Admitting: General Surgery

## 2014-10-15 ENCOUNTER — Ambulatory Visit (INDEPENDENT_AMBULATORY_CARE_PROVIDER_SITE_OTHER): Payer: 59 | Admitting: General Surgery

## 2014-10-15 VITALS — BP 134/78 | HR 80 | Resp 14 | Ht 67.0 in | Wt 135.0 lb

## 2014-10-15 DIAGNOSIS — D0511 Intraductal carcinoma in situ of right breast: Secondary | ICD-10-CM

## 2014-10-15 DIAGNOSIS — Z853 Personal history of malignant neoplasm of breast: Secondary | ICD-10-CM | POA: Insufficient documentation

## 2014-10-15 DIAGNOSIS — D0510 Intraductal carcinoma in situ of unspecified breast: Secondary | ICD-10-CM | POA: Insufficient documentation

## 2014-10-15 MED ORDER — TAMOXIFEN CITRATE 20 MG PO TABS: 20.0000 mg | ORAL_TABLET | Freq: Every day | ORAL | Status: DC

## 2014-10-15 NOTE — Progress Notes (Signed)
Patient ID: Jane RyderStacia R Sandt, female   DOB: April 26, 1961, 54 y.o.   MRN: [id number]  Chief Complaint  Patient presents with  . Follow-up    HPI Jane RyderStacia R Windish is a 54 y.o. female.  who presents for follow up breast cancer and mammogram with breast evaluation. The most recent mammogram was done on 10-08-14.  Patient does perform regular self breast checks and gets regular mammograms done.  No new breast complaints. Tolerating tamoxifen with hot flashes.  HPI  Past Medical History  Diagnosis Date  . Kidney stones 2013  . Mammographic microcalcification 2013  . 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 Pregnancy examination or test, pregnancy unconfirmed 2014  . Chicken pox   . Shingles June 2015    Past Surgical History  Procedure Laterality Date  . Breast lumpectomy with sentinel lymph node biopsy Right 09/13/2012  . Knee surgery  2007    ACL  . Laparoscopy  1988  . Lithotripsy  1992    Family History  Problem Relation Age of Onset  . Hyperlipidemia Father   . Heart disease Father   . Hypertension Father   . Stroke Paternal Grandmother   . Cancer Neg Hx     Social History History  Substance Use Topics  . Smoking status: Current Every Day Smoker -- 0.50 packs/day for 28 years    Types: Cigarettes  . Smokeless tobacco: Never Used  . Alcohol Use: 3.6 oz/week    6 Glasses of wine per week     Comment: beer or wine    No Known Allergies  Current Outpatient Prescriptions  Medication Sig Dispense Refill  . Ascorbic Acid (VITAMIN C) 100 MG tablet Take 100 mg by mouth daily.    Marland Kitchen. lisinopril (PRINIVIL,ZESTRIL) 20 MG tablet Take 1 tablet (20 mg total) by mouth daily. 90 tablet 3  . tamoxifen (NOLVADEX) 20 MG tablet Take 1 tablet (20 mg total) by mouth daily. 90 tablet 3   No current facility-administered medications for this visit.    Review of Systems Review of Systems  Constitutional: Negative.    Respiratory: Negative.   Cardiovascular: Negative.     Blood pressure 134/78, pulse 80, resp. rate 14, height 5\' 7"  (1.702 m), weight 135 lb (61.236 kg), last menstrual period 07/01/2014.  Physical Exam Physical Exam  Constitutional: She is oriented to person, place, and time. She appears well-developed and well-nourished.  Eyes: Conjunctivae are normal. No scleral icterus.  Neck: Neck supple.  Cardiovascular: Normal rate, regular rhythm and normal heart sounds.   Pulmonary/Chest: Effort normal and breath sounds normal. Right breast exhibits no inverted nipple, no mass, no nipple discharge, no skin change and no tenderness. Left breast exhibits no inverted nipple, no mass, no nipple discharge, no skin change and no tenderness.  Abdominal: Soft. Bowel sounds are normal. There is no tenderness.  Lymphadenopathy:    She has no cervical adenopathy.    She has no axillary adenopathy.  Neurological: She is alert and oriented to person, place, and time.  Skin: Skin is warm and dry.    Data Reviewed Mammogram reviewed   Assessment    Stable physical exam. Right breast DCIS,  23102yrs s/p lumpectomy and radiation, tolerating Tamoxifen.  Discussed screening colonoscopy,patient still thinking about it.   Plan  Patient to return ion one year with bilateral diagnotic mammogram .        Gerlene BurdockSANKAR,Monterio Bob G 10/15/2014, 2:59 PM

## 2014-10-15 NOTE — Patient Instructions (Addendum)
Continue self breast exams. Call office for any new breast issues or concerns.  Patient to return ion one year bilateral diagnotic mammogram .

## 2014-12-13 ENCOUNTER — Ambulatory Visit
Admit: 2014-12-13 | Disposition: A | Discharge status: Home / Self Care | Payer: Self-pay | Attending: Radiation Oncology | Admitting: Radiation Oncology

## 2014-12-20 NOTE — Consult Note (Signed)
Reason for Visit: This 54 year old Female patient presents to the clinic for initial evaluation of  breast cancer .   Referred by Dr. Evette CristalSankar.  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 She went on to have a biopsy showing ductal carcinoma in situ. Went on to have a wide local excisionfor a 18 mm ductal carcinoma in situgrade 2. Margins were clear at 0.5 mm. Tumor was ER/PR positive. There was no invasive carcinoma noted. Patient has tolerated her surgery well. She seen today for evaluation regarding adjuvant radiation therapy. She is having no breast tenderness cough or bone pain at this time.  Past Hx:    kidney stones:    Right Breast Cancer-DCIS:    Tobacco Use:    Lithotripsy:    Laparoscopy:    Knee Arthroscopy:    Right breast biopsy:   Past, Family and Social History:   Past Medical History positive    Genitourinary kidney stones    Past Surgical History right arthroscopy, lithotripsy    Family History noncontributory    Social History noncontributory    Additional Past Medical and Surgical History accompanied by her husband today   Allergies:   No Known Allergies:   Home Meds:  Home Medications: Medication Instructions Status  Vitamin C 500 mg oral tablet 1 tab(s) orally once a day Active  echinacea oral tablet 1 tab(s) orally once a day Active  Coricidin 325  mg-2 mg oral tablet 1 tab(s) orally every 6 hours, As Needed Active   Review of Systems:   General negative    Performance Status (ECOG) 0    Skin negative    Breast see HPI    Ophthalmologic negative    ENMT negative    Respiratory and Thorax negative    Cardiovascular negative    Gastrointestinal negative    Genitourinary negative    Musculoskeletal negative    Neurological negative    Psychiatric negative    Hematology/Lymphatics negative    Endocrine negative    Allergic/Immunologic negative    Review of Systems   according to nurse's notesPatient denies any weight loss, fatigue, weakness, fever, chills or night sweats. Patient denies any loss of vision, blurred vision. Patient denies any ringing  of the ears or hearing loss. No irregular heartbeat. Patient denies heart murmur or history of fainting. Patient denies any chest pain or pain radiating to her upper extremities. Patient denies any shortness of breath, difficulty breathing at night, cough or hemoptysis. Patient denies any swelling in the lower legs. Patient denies any nausea vomiting, vomiting of blood, or coffee ground material in the vomitus. Patient denies any stomach pain. Patient states has had normal bowel movements no significant constipation or diarrhea. Patient denies any dysuria, hematuria or significant nocturia. Patient denies any problems walking, swelling in the joints or loss of balance. Patient denies any skin changes, loss of hair or loss of  weight. Patient denies any excessive worrying or anxiety or significant depression. Patient denies any problems with insomnia. Patient denies excessive thirst, polyuria, polydipsia. Patient denies any swollen glands, patient denies easy bruising or easy bleeding. Patient denies any recent infections, allergies or URI. Patient "s visual fields have not changed significantly in recent time.  Nursing Notes:  Nursing Vital Signs and Chemo Nursing Nursing  Notes: *CC Vital Signs Flowsheet:   30-Jan-14 11:38   Temp Temperature 98.3   Pulse Pulse 118   Respirations Respirations 20   SBP SBP 168   Pain Scale (0-10)  0   Current Weight (kg) (kg) 66.8   Height (cm) centimeters 170.1   BSA (m2) 1.7   Physical Exam:  General/Skin/HEENT:   General normal    Skin normal    Eyes normal    ENMT normal    Additional PE well-developed well-nourished female in NAD. Lungs are clear to A&P cardiac examination shows regular rate and rhythm. Right breast has a wide local excision site around the nipple area looks complex which is healing well. She also sentinel node site which is also healing well. No dominant mass or nodularity is noted in either breast into position examined. No axillary or supraclavicular adenopathy is appreciated.   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 I had a discussion concerning adjuvant radiation therapy. Have offered either whole breast radiation or accelerated partial breast irradiation. She does have some caution very risk factors such his young age and ductal carcinoma in situ although [employer] radiology recommendations she would be a candidate for accelerated partial breast irradiation. He only whole back will be that the tumor is close to the nipple area looks complex and may make placement of the balloon and difficult. She'll see Dr. Evette Cristal early next week. She would prefer to go ahead with partial breast radiation. Will see back in accomplished otherwise we'll resort to whole breast radiation. Case was  discussed initially with Dr. Evette Cristal patient will also be a candidate for tamoxifen after completion of radiation.  I would like to take this opportunity to thank you for allowing me to continue to participate in this patient's care.  CC Referral:   cc: Dr. Evette Cristal, Dr. Farrel Conners   Electronic Signatures: Rushie Chestnut Gordy Councilman (MD)  (Signed 30-Jan-14 13:34)  Authored: HPI, Diagnosis, Past Hx, PFSH, Allergies, Home Meds, ROS, Nursing Notes, Physical Exam, Encounter Assessment and Plan, CC Referring Physician   Last Updated: 30-Jan-14 13:34 by Rebeca Alert (MD)

## 2014-12-20 NOTE — Op Note (Signed)
PATIENT NAME:  Jane Gonzales, [name] MR#:  [id number] DATE OF BIRTH:  [birth year]  DATE OF PROCEDURE:  09/13/2012  PREOPERATIVE DIAGNOSIS: Ductal carcinoma in-situ, right breast.   POSTOPERATIVE DIAGNOSIS: Ductal carcinoma in-situ, right breast.   OPERATION PERFORME: right breast lumpectomy with sentinel node biopsy.   SURGEON: S.G. Evette CristalSankar, M.D.   ANESTHESIA: General.   COMPLICATIONS: None.   ESTIMATED BLOOD LOSS: Approximately 50 mL.   DRAINS: None.   DESCRIPTION OF PROCEDURE: The patient was put to sleep in the supine position on the operating table. The right breast and axilla were then prepped and draped out as a sterile field. Preoperatively, the patient had bracketing wires placed in the medial and lateral ends of the area of calcifications, which were biopsied in the right breast from the subareolar region towards the lateral aspect. This spanned the distance of about 5 cm. She also had nuclear contrast injection preoperatively.   Sentinel node biopsy was performed first. The gamma finder showed intense signal activity in the mid inferior portion of the axilla. This area was infiltrated with 0.5% Marcaine and a transverse incision was made and deepened through the subcutaneous tissue and the deep fascia to expose the axillary fat pad. The gamma finder was utilized to find a single large node about 1.5 cm in length that was showing intense signal activity and this was sent off as sentinel node. In addition to this, there was a second node identified about 6 mm in size which had no signal activity, but this was removed and sent as a non-sentinel node. Subsequent frozen section on these two nodes showed no evidence of macro-metastases. There were no other nodes identified in the axilla and nor were there any signal activity after removal of the sentinel node.   Lumpectomy was then performed. A curved incision was made along the lateral aspect of the breast in between the two wires. The skin and  subcutaneous tissue was then elevated towards the wire placement sites on both sides; the medial end being the subareolar location. The wires were then freed from the skin and going beyond wire entrance, the glandular tissue was dissected with use of cautery for control of bleeding and circumferentially excised out. The excised tissue was then tagged for margins and sent to mammography, which showed the presence of all the calcifications seen on the mammogram and was then sent to pathology. After ensuring hemostasis, the deeper tissue was approximated with 2-0 Vicryl stitches in a radial fashion and subcutaneous tissue was also approximated with 2-0 Vicryl. The axillary wound was also closed in two layers with 2-0 Vicryl and the skin incisions were closed with subcuticular 4-0 Vicryl. Dermabond was applied. The procedure was well tolerated. She was subsequently extubated and returned to the recovery room in stable condition.   ____________________________ S.Wynona LunaG. Ingri Diemer, MD sgs:aw D: 09/14/2012 08:02:38 ET T: 09/14/2012 09:44:29 ET JOB#: [id number]  cc: Timoteo ExposeS.G. Evette CristalSankar, MD, <Dictator> [facility]PLAPUTH Wynona LunaG Juandiego Kolenovic MD ELECTRONICALLY SIGNED 09/21/2012 7:33

## 2015-02-20 ENCOUNTER — Encounter: Payer: Self-pay | Admitting: Internal Medicine

## 2015-02-20 ENCOUNTER — Ambulatory Visit (INDEPENDENT_AMBULATORY_CARE_PROVIDER_SITE_OTHER): Payer: 59 | Admitting: Internal Medicine

## 2015-02-20 VITALS — BP 132/84 | HR 113 | Temp 98.4°F | Ht 66.25 in | Wt 130.0 lb

## 2015-02-20 DIAGNOSIS — Z0000 Encounter for general adult medical examination without abnormal findings: Secondary | ICD-10-CM

## 2015-02-20 DIAGNOSIS — I10 Essential (primary) hypertension: Secondary | ICD-10-CM

## 2015-02-20 MED ORDER — LISINOPRIL 20 MG PO TABS: 20.0000 mg | ORAL_TABLET | Freq: Every day | ORAL | Status: DC

## 2015-02-20 NOTE — Addendum Note (Signed)
Addended by: Alvina Chou on: 02/20/2015 10:31 AM   Modules accepted: Orders

## 2015-02-20 NOTE — Progress Notes (Signed)
Subjective:    Patient ID: Jane Gonzales, female    DOB: Feb 11, 1961, 54 y.o.   MRN: [id number]  HPI  Pt presents to the clinic today for her annual exam. She is also due for follow up of HTN. She takes Lisinopril daily. She denies adverse effects. She denies headache, dizziness, blurred vision, chest pain or shortness of breath. Her BP today is 132/84.  Flu: 05/2014 Tetanus: more than 10 years ago LMP: postmenopausal Pap Smear: 06/2014- normal Mammogram: 09/2014- normal Colon Screening: never Eye Doctor: as needed Dentist: as needed  Diet: She eats at home mostly, she consumes a lots of salads, fruits and veggies Exercise: She plays tennis and swimming. She is also  Walking 3-4 miles per day.  Review of Systems      Past Medical History  Diagnosis Date  . Kidney stones 2013  . Mammographic microcalcification 2013  . 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 Pregnancy examination or test, pregnancy unconfirmed 2014  . Chicken pox   . Shingles June 2015    Current Outpatient Prescriptions  Medication Sig Dispense Refill  . Ascorbic Acid (VITAMIN C) 100 MG tablet Take 100 mg by mouth daily.    Marland Kitchen lisinopril (PRINIVIL,ZESTRIL) 20 MG tablet Take 1 tablet (20 mg total) by mouth daily. 90 tablet 3  . tamoxifen (NOLVADEX) 20 MG tablet Take 1 tablet (20 mg total) by mouth daily. 90 tablet 3   No current facility-administered medications for this visit.    No Known Allergies  Family History  Problem Relation Age of Onset  . Hyperlipidemia Father   . Heart disease Father   . Hypertension Father   . Stroke Paternal Grandmother   . Cancer Neg Hx     History   Social History  . Marital Status: Married    Spouse Name: N/A  . Number of Children: N/A  . Years of Education: N/A   Occupational History  . Not on file.   Social History Main Topics  . Smoking status: Current Every Day Smoker -- 0.50  packs/day for 28 years    Types: Cigarettes  . Smokeless tobacco: Never Used  . Alcohol Use: 3.6 oz/week    6 Glasses of wine per week     Comment: beer or wine  . Drug Use: No  . Sexual Activity: Yes   Other Topics Concern  . Not on file   Social History Narrative     Constitutional: Denies fever, malaise, fatigue, headache or abrupt weight changes.  HEENT: Denies eye pain, eye redness, ear pain, ringing in the ears, wax buildup, runny nose, nasal congestion, bloody nose, or sore throat. Respiratory: Denies difficulty breathing, shortness of breath, cough or sputum production.   Cardiovascular: Denies chest pain, chest tightness, palpitations or swelling in the hands or feet.  Gastrointestinal: Denies abdominal pain, bloating, constipation, diarrhea or blood in the stool.  GU: Denies urgency, frequency, pain with urination, burning sensation, blood in urine, odor or discharge. Musculoskeletal: Denies decrease in range of motion, difficulty with gait, muscle pain or joint pain and swelling.  Skin: Denies redness, rashes, lesions or ulcercations.  Neurological: Denies dizziness, difficulty with memory, difficulty with speech or problems with balance and coordination.  Psych: Denies anxiety, depression, SI/HI.  No other specific complaints in a complete review of systems (except as listed in HPI above).  Objective:   Physical Exam   BP 132/84 mmHg  Pulse 113  Temp(Src) 98.4 F (36.9 C) (Oral)  Ht 5' 6.25" (1.683 m)  Wt 130 lb (58.968 kg)  BMI 20.82 kg/m2  SpO2 99%  LMP 02/18/2015 Wt Readings from Last 3 Encounters:  02/20/15 130 lb (58.968 kg)  10/15/14 135 lb (61.236 kg)  04/09/14 135 lb (61.236 kg)    General: Appears her stated age, well developed, well nourished in NAD. Skin: Warm, dry and intact. No rashes, lesions or ulcerations noted. HEENT: Head: normal shape and size; Eyes: sclera white, no icterus, conjunctiva pink, PERRLA and EOMs intact; Ears: Tm's gray and  intact, normal light reflex; Throat/Mouth: Teeth present, mucosa pink and moist, no exudate, lesions or ulcerations noted.  Neck:  Neck supple, trachea midline. No masses, lumps or thyromegaly present.  Cardiovascular: Tachycardic with normal rhythm. S1,S2 noted.  No murmur, rubs or gallops noted. No JVD or BLE edema. No carotid bruits noted. Pulmonary/Chest: Normal effort and positive vesicular breath sounds. No respiratory distress. No wheezes, rales or ronchi noted.  Abdomen: Soft and nontender. Normal bowel sounds, no bruits noted. No distention or masses noted. Liver, spleen and kidneys non palpable. Musculoskeletal: Normal range of motion. Strength 5/5 BUE/BLE. No difficulty with gait.  Neurological: Alert and oriented. Cranial nerves II-XII grossly intact. Coordination normal.  Psychiatric: Mood and affect normal. Behavior is normal. Judgment and thought content normal.     BMET    Component Value Date/Time   NA 139 02/18/2014 1412   K 4.6 02/18/2014 1412   CL 100 02/18/2014 1412   CO2 20 02/18/2014 1412   GLUCOSE 118* 02/18/2014 1412   BUN 10 02/18/2014 1412   CREATININE 0.79 02/18/2014 1412   CALCIUM 10.1 02/18/2014 1412   GFRNONAA 87 02/18/2014 1412   GFRAA 100 02/18/2014 1412    Lipid Panel     Component Value Date/Time   CHOL 141 02/18/2014 1412   TRIG 164* 02/18/2014 1412   HDL 52 02/18/2014 1412   CHOLHDL 2.7 02/18/2014 1412   LDLCALC 56 02/18/2014 1412    CBC    Component Value Date/Time   WBC 6.8 02/18/2014 1412   WBC 9.2 12/07/2012 0814   RBC 4.19 02/18/2014 1412   RBC 4.62 12/07/2012 0814   HGB 14.1 02/18/2014 1412   HGB 15.4 12/07/2012 0814   HCT 41.1 02/18/2014 1412   HCT 46.0 12/07/2012 0814   PLT 305 02/18/2014 1412   PLT 233 12/07/2012 0814   MCV 98* 02/18/2014 1412   MCV 100 12/07/2012 0814   MCH 33.7* 02/18/2014 1412   MCH 33.4 12/07/2012 0814   MCHC 34.3 02/18/2014 1412   MCHC 33.5 12/07/2012 0814   RDW 13.1 02/18/2014 1412   RDW 12.5  12/07/2012 0814   LYMPHSABS 2.1 02/18/2014 1412   LYMPHSABS 0.7* 12/07/2012 0814   MONOABS 0.7 12/07/2012 0814   EOSABS 0.1 02/18/2014 1412   EOSABS 0.0 12/07/2012 0814   BASOSABS 0.0 02/18/2014 1412   BASOSABS 0.0 12/07/2012 0814    Hgb A1C No results found for: HGBA1C      Assessment & Plan:   Preventative Health Maintenance:  She is not interested in Colonoscopy or IFOB at this time. She wants things to get settle out with the breast cancer and then she will schedule a Colonoscopy. Will order CBC, CMET, Lipid and Vit D today All other HM UTD Encouraged her to see an eye doctor and dentist at least annually   RTC in 1 year or sooner if needed

## 2015-02-20 NOTE — Assessment & Plan Note (Signed)
BP well controlled on Lisinopril Will check CMET today Medication refilled x 1 year

## 2015-02-20 NOTE — Progress Notes (Signed)
Pre visit review using our clinic review tool, if applicable. No additional management support is needed unless otherwise documented below in the visit note. 

## 2015-02-20 NOTE — Patient Instructions (Addendum)
I have refilled your Lisinopril x 1 year I have ordered labs for [facility], I will send you a letter once I get the results See you in 54 years!  Health Maintenance Adopting a healthy lifestyle and getting preventive care can go a Skog way to promote health and wellness. Talk with your health care provider about what schedule of regular examinations is right for you. This is a good chance for you to check in with your provider about disease prevention and staying healthy. In between checkups, there are plenty of things you can do on your own. Experts have done a lot of research about which lifestyle changes and preventive measures are most likely to keep you healthy. Ask your health care provider for more information. WEIGHT AND DIET  Eat a healthy diet  Be sure to include plenty of vegetables, fruits, low-fat dairy products, and lean protein.  Do not eat a lot of foods high in solid fats, added sugars, or salt.  Get regular exercise. This is one of the most important things you can do for your health.  Most adults should exercise for at least 150 minutes each week. The exercise should increase your heart rate and make you sweat (moderate-intensity exercise).  Most adults should also do strengthening exercises at least twice a week. This is in addition to the moderate-intensity exercise.  Maintain a healthy weight  Body mass index (BMI) is a measurement that can be used to identify possible weight problems. It estimates body fat based on height and weight. Your health care provider can help determine your BMI and help you achieve or maintain a healthy weight.  For females 52 years of age and older:   A BMI below 18.5 is considered underweight.  A BMI of 18.5 to 24.9 is normal.  A BMI of 25 to 29.9 is considered overweight.  A BMI of 30 and above is considered obese.  Watch levels of cholesterol and blood lipids  You should start having your blood tested for lipids and cholesterol at  52 years of age, then have this test every 5 years.  You may need to have your cholesterol levels checked more often if:  Your lipid or cholesterol levels are high.  You are older than 52 years of age.  You are at high risk for heart disease.  CANCER SCREENING   Lung Cancer  Lung cancer screening is recommended for adults 52-15 years old who are at high risk for lung cancer because of a history of smoking.  A yearly low-dose CT scan of the lungs is recommended for people who:  Currently smoke.  Have quit within the past 15 years.  Have at least a 30-pack-year history of smoking. A pack year is smoking an average of one pack of cigarettes a day for 1 year.  Yearly screening should continue until it has been 15 years since you quit.  Yearly screening should stop if you develop a health problem that would prevent you from having lung cancer treatment.  Breast Cancer  Practice breast self-awareness. This means understanding how your breasts normally appear and feel.  It also means doing regular breast self-exams. Let your health care provider know about any changes, no matter how small.  If you are in your 52s or 30s, you should have a clinical breast exam (CBE) by a health care provider every 1-3 years as part of a regular health exam.  If you are 52 or older, have a CBE every year. Also  consider having a breast X-ray (mammogram) every year.  If you have a family history of breast cancer, talk to your health care provider about genetic screening.  If you are at high risk for breast cancer, talk to your health care provider about having an MRI and a mammogram every year.  Breast cancer gene (BRCA) assessment is recommended for women who have family members with BRCA-related cancers. BRCA-related cancers include:  Breast.  Ovarian.  Tubal.  Peritoneal cancers.  Results of the assessment will determine the need for genetic counseling and BRCA1 and BRCA2  testing. Cervical Cancer Routine pelvic examinations to screen for cervical cancer are no longer recommended for nonpregnant women who are considered low risk for cancer of the pelvic organs (ovaries, uterus, and vagina) and who do not have symptoms. A pelvic examination may be necessary if you have symptoms including those associated with pelvic infections. Ask your health care provider if a screening pelvic exam is right for you.   The Pap test is the screening test for cervical cancer for women who are considered at risk.  If you had a hysterectomy for a problem that was not cancer or a condition that could lead to cancer, then you no longer need Pap tests.  If you are older than 65 years, and you have had normal Pap tests for the past 10 years, you no longer need to have Pap tests.  If you have had past treatment for cervical cancer or a condition that could lead to cancer, you need Pap tests and screening for cancer for at least 20 years after your treatment.  If you no longer get a Pap test, assess your risk factors if they change (such as having a new sexual partner). This can affect whether you should start being screened again.  Some women have medical problems that increase their chance of getting cervical cancer. If this is the case for you, your health care provider may recommend more frequent screening and Pap tests.  The human papillomavirus (HPV) test is another test that may be used for cervical cancer screening. The HPV test looks for the virus that can cause cell changes in the cervix. The cells collected during the Pap test can be tested for HPV.  The HPV test can be used to screen women 52 years of age and older. Getting tested for HPV can extend the interval between normal Pap tests from three to five years.  An HPV test also should be used to screen women of any age who have unclear Pap test results.  After 52 years of age, women should have HPV testing as often as Pap  tests.  Colorectal Cancer  This type of cancer can be detected and often prevented.  Routine colorectal cancer screening usually begins at 52 years of age and continues through 52 years of age.  Your health care provider may recommend screening at an earlier age if you have risk factors for colon cancer.  Your health care provider may also recommend using home test kits to check for hidden blood in the stool.  A small camera at the end of a tube can be used to examine your colon directly (sigmoidoscopy or colonoscopy). This is done to check for the earliest forms of colorectal cancer.  Routine screening usually begins at age 89.  Direct examination of the colon should be repeated every 5-10 years through 52 years of age. However, you may need to be screened more often if early forms of  precancerous polyps or small growths are found. Skin Cancer  Check your skin from head to toe regularly.  Tell your health care provider about any new moles or changes in moles, especially if there is a change in a mole's shape or color.  Also tell your health care provider if you have a mole that is larger than the size of a pencil eraser.  Always use sunscreen. Apply sunscreen liberally and repeatedly throughout the day.  Protect yourself by wearing Halbert sleeves, pants, a wide-brimmed hat, and sunglasses whenever you are outside. HEART DISEASE, DIABETES, AND HIGH BLOOD PRESSURE   Have your blood pressure checked at least every 1-2 years. High blood pressure causes heart disease and increases the risk of stroke.  If you are between 53 years and 72 years old, ask your health care provider if you should take aspirin to prevent strokes.  Have regular diabetes screenings. This involves taking a blood sample to check your fasting blood sugar level.  If you are at a normal weight and have a low risk for diabetes, have this test once every three years after 52 years of age.  If you are overweight and  have a high risk for diabetes, consider being tested at a younger age or more often. PREVENTING INFECTION  Hepatitis B  If you have a higher risk for hepatitis B, you should be screened for this virus. You are considered at high risk for hepatitis B if:  You were born in a country where hepatitis B is common. Ask your health care provider which countries are considered high risk.  Your parents were born in a high-risk country, and you have not been immunized against hepatitis B (hepatitis B vaccine).  You have HIV or AIDS.  You use needles to inject street drugs.  You live with someone who has hepatitis B.  You have had sex with someone who has hepatitis B.  You get hemodialysis treatment.  You take certain medicines for conditions, including cancer, organ transplantation, and autoimmune conditions. Hepatitis C  Blood testing is recommended for:  Everyone born from [birth year] through 1963.  Anyone with known risk factors for hepatitis C. Sexually transmitted infections (STIs)  You should be screened for sexually transmitted infections (STIs) including gonorrhea and chlamydia if:  You are sexually active and are younger than 52 years of age.  You are older than 52 years of age and your health care provider tells you that you are at risk for this type of infection.  Your sexual activity has changed since you were last screened and you are at an increased risk for chlamydia or gonorrhea. Ask your health care provider if you are at risk.  If you do not have HIV, but are at risk, it may be recommended that you take a prescription medicine daily to prevent HIV infection. This is called pre-exposure prophylaxis (PrEP). You are considered at risk if:  You are sexually active and do not regularly use condoms or know the HIV status of your partner(s).  You take drugs by injection.  You are sexually active with a partner who has HIV. Talk with your health care provider about whether you  are at high risk of being infected with HIV. If you choose to begin PrEP, you should first be tested for HIV. You should then be tested every 3 months for as [place] as you are taking PrEP.  PREGNANCY   If you are premenopausal and you may become pregnant, ask your health care  provider about preconception counseling.  If you may become pregnant, take 400 to 800 micrograms (mcg) of folic acid every day.  If you want to prevent pregnancy, talk to your health care provider about birth control (contraception). OSTEOPOROSIS AND MENOPAUSE   Osteoporosis is a disease in which the bones lose minerals and strength with aging. This can result in serious bone fractures. Your risk for osteoporosis can be identified using a bone density scan.  If you are 75 years of age or older, or if you are at risk for osteoporosis and fractures, ask your health care provider if you should be screened.  Ask your health care provider whether you should take a calcium or vitamin D supplement to lower your risk for osteoporosis.  Menopause may have certain physical symptoms and risks.  Hormone replacement therapy may reduce some of these symptoms and risks. Talk to your health care provider about whether hormone replacement therapy is right for you.  HOME CARE INSTRUCTIONS   Schedule regular health, dental, and eye exams.  Stay current with your immunizations.   Do not use any tobacco products including cigarettes, chewing tobacco, or electronic cigarettes.  If you are pregnant, do not drink alcohol.  If you are breastfeeding, limit how much and how often you drink alcohol.  Limit alcohol intake to no more than 1 drink per day for nonpregnant women. One drink equals 12 ounces of beer, 5 ounces of wine, or 1 ounces of hard liquor.  Do not use street drugs.  Do not share needles.  Ask your health care provider for help if you need support or information about quitting drugs.  Tell your health care provider if  you often feel depressed.  Tell your health care provider if you have ever been abused or do not feel safe at home. Document Released: 03/01/2011 Document Revised: 12/31/2013 Document Reviewed: 07/18/2013 [facility] Patient Information 2015 [place], [place]. This information is not intended to replace advice given to you by your health care provider. Make sure you discuss any questions you have with your health care provider.

## 2015-02-21 LAB — COMPREHENSIVE METABOLIC PANEL
ALK PHOS: 57 IU/L (ref 39–117)
ALT: 12 IU/L (ref 0–32)
AST: 15 IU/L (ref 0–40)
Albumin/Globulin Ratio: 1.7 (ref 1.1–2.5)
Albumin: 4.3 g/dL (ref 3.5–5.5)
BILIRUBIN TOTAL: 0.5 mg/dL (ref 0.0–1.2)
BUN / CREAT RATIO: 12 (ref 9–23)
BUN: 9 mg/dL (ref 6–24)
CHLORIDE: 102 mmol/L (ref 97–108)
CO2: 21 mmol/L (ref 18–29)
CREATININE: 0.78 mg/dL (ref 0.57–1.00)
Calcium: 9.8 mg/dL (ref 8.7–10.2)
GFR calc non Af Amer: 88 mL/min/{1.73_m2} (ref >59)
GFR, EST AFRICAN AMERICAN: 101 mL/min/{1.73_m2} (ref >59)
GLOBULIN, TOTAL: 2.6 g/dL (ref 1.5–4.5)
Glucose: 103 mg/dL — ABNORMAL HIGH (ref 65–99)
Potassium: 4.6 mmol/L (ref 3.5–5.2)
SODIUM: 141 mmol/L (ref 134–144)
Total Protein: 6.9 g/dL (ref 6.0–8.5)

## 2015-02-21 LAB — CBC WITH DIFFERENTIAL/PLATELET
Basophils Absolute: 0 10*3/uL (ref 0.0–0.2)
Basos: 1 %
EOS (ABSOLUTE): 0 10*3/uL (ref 0.0–0.4)
EOS: 1 %
HEMATOCRIT: 40.2 % (ref 34.0–46.6)
Hemoglobin: 13.7 g/dL (ref 11.1–15.9)
Immature Grans (Abs): 0 10*3/uL (ref 0.0–0.1)
Immature Granulocytes: 0 %
Lymphocytes Absolute: 1.2 10*3/uL (ref 0.7–3.1)
Lymphs: 18 %
MCH: 33.7 pg — ABNORMAL HIGH (ref 26.6–33.0)
MCHC: 34.1 g/dL (ref 31.5–35.7)
MCV: 99 fL — AB (ref 79–97)
MONOCYTES: 9 %
Monocytes Absolute: 0.6 10*3/uL (ref 0.1–0.9)
Neutrophils Absolute: 4.7 10*3/uL (ref 1.4–7.0)
Neutrophils: 71 %
PLATELETS: 329 10*3/uL (ref 150–379)
RBC: 4.07 x10E6/uL (ref 3.77–5.28)
RDW: 12.6 % (ref 12.3–15.4)
WBC: 6.6 10*3/uL (ref 3.4–10.8)

## 2015-02-21 LAB — LIPID PANEL
Chol/HDL Ratio: 3.4 ratio units (ref 0.0–4.4)
Cholesterol, Total: 155 mg/dL (ref 100–199)
HDL: 45 mg/dL (ref >39)
LDL CALC: 90 mg/dL (ref 0–99)
TRIGLYCERIDES: 98 mg/dL (ref 0–149)
VLDL Cholesterol Cal: 20 mg/dL (ref 5–40)

## 2015-02-21 LAB — VITAMIN D 25 HYDROXY (VIT D DEFICIENCY, FRACTURES): Vit D, 25-Hydroxy: 27.4 ng/mL — ABNORMAL LOW (ref 30.0–100.0)

## 2015-02-24 ENCOUNTER — Telehealth: Payer: Self-pay

## 2015-02-24 ENCOUNTER — Other Ambulatory Visit: Payer: Self-pay | Admitting: Internal Medicine

## 2015-02-24 MED ORDER — ZOSTER VACCINE LIVE 19400 UNT/0.65ML ~~LOC~~ SOLR: 0.6500 mL | Freq: Once | SUBCUTANEOUS | Status: DC

## 2015-02-24 NOTE — Telephone Encounter (Signed)
Pt notified labs and Rx are ready for pick-up

## 2015-02-24 NOTE — Telephone Encounter (Signed)
(  see prev note) I printed out labs, please print Rx for shingles vaccine and I will place both at the front desk

## 2015-02-24 NOTE — Telephone Encounter (Signed)
RX printed and signed, given to [employer]

## 2015-02-24 NOTE — Telephone Encounter (Signed)
Pt left v/m requesting rx for shingles vaccine and copy of 02/20/15 labs. Pt would like to pick up today at lunchtime. Pt request cb.

## 2015-02-26 ENCOUNTER — Other Ambulatory Visit: Payer: Self-pay

## 2015-02-26 DIAGNOSIS — I10 Essential (primary) hypertension: Secondary | ICD-10-CM

## 2015-02-26 MED ORDER — LISINOPRIL 20 MG PO TABS: 20.0000 mg | ORAL_TABLET | Freq: Every day | ORAL | Status: DC

## 2015-03-21 ENCOUNTER — Telehealth: Payer: Self-pay | Admitting: *Deleted

## 2015-03-21 NOTE — Telephone Encounter (Signed)
Called patient and gave full price of shingles vaccine.  Patient wants to get it at the pharmacy because it is cheaper there.

## 2015-03-21 NOTE — Telephone Encounter (Signed)
Pt left voicemail at Triage wanting to know the full price of the shingle vaccine if your insurance doesn't pay for it. Pt said if she doesn't answer we can leave her a voicemail

## 2015-06-13 ENCOUNTER — Ambulatory Visit (INDEPENDENT_AMBULATORY_CARE_PROVIDER_SITE_OTHER): Payer: 59

## 2015-06-13 DIAGNOSIS — Z23 Encounter for immunization: Secondary | ICD-10-CM

## 2015-08-19 ENCOUNTER — Encounter: Payer: Self-pay | Admitting: *Deleted

## 2015-10-09 ENCOUNTER — Telehealth: Payer: Self-pay | Admitting: *Deleted

## 2015-10-09 NOTE — Telephone Encounter (Signed)
Patient called stated that [facility] was going to have to reschedule her mammogram appointment due to the machine being broke. The patient stated she did not want to reschedule at this time because it was a "hassle" but she wanted to get her tamoxifen refilled. I advised her that she may need to see you for this.

## 2015-10-15 ENCOUNTER — Ambulatory Visit: Payer: 59 | Admitting: General Surgery

## 2015-10-27 ENCOUNTER — Encounter: Payer: Self-pay | Admitting: General Surgery

## 2015-10-29 ENCOUNTER — Ambulatory Visit (INDEPENDENT_AMBULATORY_CARE_PROVIDER_SITE_OTHER): Payer: 59 | Admitting: General Surgery

## 2015-10-29 ENCOUNTER — Encounter: Payer: Self-pay | Admitting: General Surgery

## 2015-10-29 VITALS — BP 128/80 | HR 82 | Resp 16 | Ht 66.5 in | Wt 132.0 lb

## 2015-10-29 DIAGNOSIS — D0511 Intraductal carcinoma in situ of right breast: Secondary | ICD-10-CM

## 2015-10-29 NOTE — Progress Notes (Signed)
Patient ID: Jane Gonzales, female   DOB: 1960-09-06, 55 y.o.   MRN: [id number]  Chief Complaint  Patient presents with  . Follow-up    mammogram    HPI Jane Gonzales is a 55 y.o. female.  who presents for follow up breast cancer and a breast evaluation. The most recent mammogram was done on 10-24-15.  Patient does perform regular self breast checks and gets regular mammograms done.  No new breast issues. Tolerating tamoxifen with some hot flashes. I have reviewed the history of present illness with the patient.    HPI  Past Medical History  Diagnosis Date  . Kidney stones 2013  . Mammographic microcalcification 2013  . 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 Pregnancy examination or test, pregnancy unconfirmed 2014  . Chicken pox   . Shingles June 2015    Past Surgical History  Procedure Laterality Date  . Breast lumpectomy with sentinel lymph node biopsy Right 09/13/2012  . Knee surgery  2007    ACL  . Laparoscopy  1988  . Lithotripsy  1992    Family History  Problem Relation Age of Onset  . Hyperlipidemia Father   . Heart disease Father   . Hypertension Father   . Stroke Paternal Grandmother   . Cancer Neg Hx     Social History Social History  Substance Use Topics  . Smoking status: Current Every Day Smoker -- 0.50 packs/day for 28 years    Types: Cigarettes  . Smokeless tobacco: Never Used  . Alcohol Use: 3.6 oz/week    6 Glasses of wine per week     Comment: beer or wine---occasional    No Known Allergies  Current Outpatient Prescriptions  Medication Sig Dispense Refill  . Ascorbic Acid (VITAMIN C) 1000 MG tablet Take 1,000 mg by mouth daily.    Marland Kitchen lisinopril (PRINIVIL,ZESTRIL) 20 MG tablet Take 1 tablet (20 mg total) by mouth daily. 90 tablet 3  . tamoxifen (NOLVADEX) 20 MG tablet Take 1 tablet (20 mg total) by mouth daily. 90 tablet 3   No current facility-administered medications for  this visit.    Review of Systems Review of Systems  Constitutional: Negative.   Respiratory: Negative.   Cardiovascular: Negative.     Blood pressure 128/80, pulse 82, resp. rate 16, height 5' 6.5" (1.689 m), weight 132 lb (59.875 kg), last menstrual period 05/20/2015.  Physical Exam Physical Exam  Constitutional: She is oriented to person, place, and time. She appears well-developed and well-nourished.  Eyes: Conjunctivae are normal. No scleral icterus.  Neck: Neck supple.  Cardiovascular: Normal rate, regular rhythm and normal heart sounds.   Pulmonary/Chest: Effort normal and breath sounds normal. Right breast exhibits no inverted nipple, no mass, no nipple discharge, no skin change and no tenderness. Left breast exhibits no inverted nipple, no mass, no nipple discharge, no skin change and no tenderness.  Abdominal: Soft. Bowel sounds are normal. There is no tenderness.  Lymphadenopathy:    She has no cervical adenopathy.    She has no axillary adenopathy.  Neurological: She is alert and oriented to person, place, and time.  Skin: Skin is warm and dry.    Data Reviewed Mammogram reviewed   Assessment    Stable physical exam. Right breast DCIS, 75yrs s/p lumpectomy and radiation,   Discussed use of effexor with patient- this may alleviate some of the hot flashes. She will think about  this.  Plan    The patient has been asked to return to the office in one year with a bilateral diagnostic mammogram.     PCP:  Nicki Reaper  This information has been scribed by Dorathy Daft RNBC.   SANKAR,SEEPLAPUTHUR G 10/30/2015, 10:34 AM

## 2015-10-29 NOTE — Patient Instructions (Addendum)
The patient is aware to call back for any questions or concerns. The patient has been asked to return to the office in one year with a bilateral diagnostic mammogram. 

## 2015-10-30 ENCOUNTER — Encounter: Payer: Self-pay | Admitting: General Surgery

## 2015-11-04 ENCOUNTER — Ambulatory Visit: Payer: 59 | Admitting: General Surgery

## 2015-11-18 ENCOUNTER — Telehealth: Payer: Self-pay | Admitting: Internal Medicine

## 2015-11-18 NOTE — Telephone Encounter (Signed)
Spoke to pt and she states that she could not come in---advised pt to schedule appt for tomorrow as the office will be limited on appts. Pt scheduled with Dr Milinda Antisower at 4:30--advised pt to call if she does not want to keep appt--pt expressed understanding

## 2015-11-18 NOTE — Telephone Encounter (Signed)
Well if she is refusing to come in to be seen or seen at another office, I recommend UC if worse.

## 2015-11-18 NOTE — Telephone Encounter (Signed)
I spoke with pt and has non prod cough since 11/13/15; pt started Z pak by MD live thru [facility]. And on 11/17/15 hurts in rt rib cage when takes a breath or coughs. Offered pt appt at another [facility] office and pt said too far to go; asked pt about going to an UC and she said she was not going to UC; pt was upset for taking so Rhine for someone to call her back; apologized but [facility] to [facility] her this morning and was not able to reach pt. Pt said she was showering and missed calls. pt said if she has pneumonia she will just have pneumonia. [facility] and pt said no she has to return to work on 11/19/15.

## 2015-11-18 NOTE — Telephone Encounter (Signed)
Patient Name: Jane Gonzales DOB: [birth year] Initial Comment Caller States she is coughing, has pain under her breast when coughing Nurse Assessment Guidelines Guideline Title Affirmed Question Affirmed Notes Final Disposition User Three attempts. Unable to reach [place]. Left voice message to call back as needed.

## 2015-11-19 ENCOUNTER — Ambulatory Visit: Payer: 59 | Admitting: Family Medicine

## 2015-11-20 ENCOUNTER — Other Ambulatory Visit: Payer: Self-pay | Admitting: General Surgery

## 2016-03-18 ENCOUNTER — Other Ambulatory Visit: Payer: Self-pay | Admitting: Family Medicine

## 2016-03-18 ENCOUNTER — Ambulatory Visit (INDEPENDENT_AMBULATORY_CARE_PROVIDER_SITE_OTHER): Payer: 59 | Admitting: Internal Medicine

## 2016-03-18 ENCOUNTER — Encounter: Payer: Self-pay | Admitting: Internal Medicine

## 2016-03-18 VITALS — BP 128/86 | HR 100 | Temp 98.3°F | Ht 66.33 in | Wt 134.0 lb

## 2016-03-18 DIAGNOSIS — Z0000 Encounter for general adult medical examination without abnormal findings: Secondary | ICD-10-CM

## 2016-03-18 DIAGNOSIS — Z1159 Encounter for screening for other viral diseases: Secondary | ICD-10-CM

## 2016-03-18 DIAGNOSIS — I10 Essential (primary) hypertension: Secondary | ICD-10-CM

## 2016-03-18 DIAGNOSIS — D0511 Intraductal carcinoma in situ of right breast: Secondary | ICD-10-CM | POA: Diagnosis not present

## 2016-03-18 DIAGNOSIS — F17200 Nicotine dependence, unspecified, uncomplicated: Secondary | ICD-10-CM

## 2016-03-18 DIAGNOSIS — Z720 Tobacco use: Secondary | ICD-10-CM

## 2016-03-18 DIAGNOSIS — N951 Menopausal and female climacteric states: Secondary | ICD-10-CM | POA: Diagnosis not present

## 2016-03-18 DIAGNOSIS — Z114 Encounter for screening for human immunodeficiency virus [HIV]: Secondary | ICD-10-CM

## 2016-03-18 MED ORDER — VENLAFAXINE HCL ER 37.5 MG PO CP24: 37.5000 mg | ORAL_CAPSULE | Freq: Every day | ORAL | Status: DC

## 2016-03-18 MED ORDER — LISINOPRIL 20 MG PO TABS: 20.0000 mg | ORAL_TABLET | Freq: Every day | ORAL | Status: DC

## 2016-03-18 NOTE — Addendum Note (Signed)
Addended by: Baldomero LamyHAVERS, Damauri Minion C on: 03/18/2016 04:27 PM   Modules accepted: Kipp BroodSmartSet

## 2016-03-18 NOTE — Assessment & Plan Note (Signed)
Following with Dr. Evette CristalSankar Continue Tamoxifen

## 2016-03-18 NOTE — Progress Notes (Signed)
Pre visit review using our clinic review tool, if applicable. No additional management support is needed unless otherwise documented below in the visit note. 

## 2016-03-18 NOTE — Progress Notes (Addendum)
Subjective:    Patient ID: Jane Gonzales, female    DOB: 04/08/1961, 55 y.o.   MRN: [id number]  HPI  Pt presents to the clinic today for her annual exam. She is also due for follow up of chronic conditions.  HTN: She takes Lisinopril daily. She denies adverse effects. She denies headache, dizziness, blurred vision, chest pain or shortness of breath. Her BP today is 132/84. There is no ECG on file.  History of Breast Cancer: s/p lumpectomy. Follows with Dr. Evette Cristal. Mammogram done 10/2015. She is on Tamoxifen.  Flu: 05/2015 Tetanus: more than 10 years ago Zostovax: 02/2015 LMP: postmenopausal Pap Smear: 07/2015- normal Mammogram: 10/2015- normal Bone Density: never Colon Screening: never Eye Doctor: as needed Dentist: annually  Diet: She eats at home mostly, she consumes a lots of salads, fruits and veggies. She tries to avoid fried foods. She drinks mostly water. Exercise: She plays tennis and swimming. She is also  Walking 3-4 miles per day.  She is smoking 10 cigarettes per day. She is trying to wean down so that eventually she can quit.  Review of Systems      Past Medical History  Diagnosis Date  . Kidney stones 2013  . Mammographic microcalcification 2013  . 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 Pregnancy examination or test, pregnancy unconfirmed 2014  . Chicken pox   . Shingles June 2015    Current Outpatient Prescriptions  Medication Sig Dispense Refill  . Ascorbic Acid (VITAMIN C) 1000 MG tablet Take 1,000 mg by mouth daily.    Marland Kitchen lisinopril (PRINIVIL,ZESTRIL) 20 MG tablet Take 1 tablet (20 mg total) by mouth daily. OFFICE VISIT REQUIRED FOR ADDITIONAL REFILLS 90 tablet 0  . tamoxifen (NOLVADEX) 20 MG tablet Take 1 tablet by mouth  daily 90 tablet 3   No current facility-administered medications for this visit.    No Known Allergies  Family History  Problem Relation Age of Onset  .  Hyperlipidemia Father   . Heart disease Father   . Hypertension Father   . Stroke Paternal Grandmother   . Cancer Neg Hx     Social History   Social History  . Marital Status: Married    Spouse Name: N/A  . Number of Children: N/A  . Years of Education: N/A   Occupational History  . Not on file.   Social History Main Topics  . Smoking status: Current Every Day Smoker -- 0.50 packs/day for 28 years    Types: Cigarettes  . Smokeless tobacco: Never Used  . Alcohol Use: 3.6 oz/week    6 Glasses of wine per week     Comment: beer or wine---occasional  . Drug Use: No  . Sexual Activity: Yes   Other Topics Concern  . Not on file   Social History Narrative     Constitutional: Denies fever, malaise, fatigue, headache or abrupt weight changes.  HEENT: Denies eye pain, eye redness, ear pain, ringing in the ears, wax buildup, runny nose, nasal congestion, bloody nose, or sore throat. Respiratory: Denies difficulty breathing, shortness of breath, cough or sputum production.   Cardiovascular: Denies chest pain, chest tightness, palpitations or swelling in the hands or feet.  Gastrointestinal: Denies abdominal pain, bloating, constipation, diarrhea or blood in the stool.  GU: Pt reports vaginal dryness. Denies urgency, frequency, pain with urination, burning sensation, blood in urine, odor or discharge. Musculoskeletal: Denies decrease in range  of motion, difficulty with gait, muscle pain or joint pain and swelling.  Skin: Denies redness, rashes, lesions or ulcercations.  Neurological: Pt reports hot flashes. Denies dizziness, difficulty with memory, difficulty with speech or problems with balance and coordination.  Psych: Pt reports mood swings. Denies anxiety, depression, SI/HI.  No other specific complaints in a complete review of systems (except as listed in HPI above).  Objective:   Physical Exam   BP 128/86 mmHg  Pulse 100  Temp(Src) 98.3 F (36.8 C) (Oral)  Ht 5' 6.33"  (1.685 m)  Wt 134 lb (60.782 kg)  BMI 21.41 kg/m2  SpO2 98%  Wt Readings from Last 3 Encounters:  10/29/15 132 lb (59.875 kg)  02/20/15 130 lb (58.968 kg)  10/15/14 135 lb (61.236 kg)    General: Appears her stated age, well developed, well nourished in NAD. Skin: Warm, dry and intact.  HEENT: Head: normal shape and size; Eyes: sclera white, no icterus, conjunctiva pink, PERRLA and EOMs intact; Ears: Tm's gray and intact, normal light reflex; Throat/Mouth: Teeth present, mucosa pink and moist, no exudate, lesions or ulcerations noted.  Neck:  Neck supple, trachea midline. No masses, lumps or thyromegaly present.  Cardiovascular: Tachycardic with normal rhythm. S1,S2 noted.  No murmur, rubs or gallops noted. No JVD or BLE edema. No carotid bruits noted. Pulmonary/Chest: Normal effort and positive vesicular breath sounds. No respiratory distress. No wheezes, rales or ronchi noted.  Abdomen: Soft and nontender. Normal bowel sounds. No distention or masses noted. Liver, spleen and kidneys non palpable. Musculoskeletal: Normal range of motion. Strength 5/5 BUE/BLE. No difficulty with gait.  Neurological: Alert and oriented. Cranial nerves II-XII grossly intact. Coordination normal.  Psychiatric: Mood and affect normal. Behavior is normal. Judgment and thought content normal.     BMET    Component Value Date/Time   NA 141 02/20/2015 1031   K 4.6 02/20/2015 1031   CL 102 02/20/2015 1031   CO2 21 02/20/2015 1031   GLUCOSE 103* 02/20/2015 1031   BUN 9 02/20/2015 1031   CREATININE 0.78 02/20/2015 1031   CALCIUM 9.8 02/20/2015 1031   GFRNONAA 88 02/20/2015 1031   GFRAA 101 02/20/2015 1031    Lipid Panel     Component Value Date/Time   CHOL 155 02/20/2015 1031   TRIG 98 02/20/2015 1031   HDL 45 02/20/2015 1031   CHOLHDL 3.4 02/20/2015 1031   LDLCALC 90 02/20/2015 1031    CBC    Component Value Date/Time   WBC 6.6 02/20/2015 1031   WBC 9.2 12/07/2012 0814   RBC 4.07  02/20/2015 1031   RBC 4.62 12/07/2012 0814   HGB 14.1 02/18/2014 1412   HGB 15.4 12/07/2012 0814   HCT 40.2 02/20/2015 1031   HCT 41.1 02/18/2014 1412   HCT 46.0 12/07/2012 0814   PLT 329 02/20/2015 1031   PLT 305 02/18/2014 1412   PLT 233 12/07/2012 0814   MCV 99* 02/20/2015 1031   MCV 98* 02/18/2014 1412   MCV 100 12/07/2012 0814   MCH 33.7* 02/20/2015 1031   MCH 33.4 12/07/2012 0814   MCHC 34.1 02/20/2015 1031   MCHC 33.5 12/07/2012 0814   RDW 12.6 02/20/2015 1031   RDW 12.5 12/07/2012 0814   LYMPHSABS 1.2 02/20/2015 1031   LYMPHSABS 0.7* 12/07/2012 0814   MONOABS 0.7 12/07/2012 0814   EOSABS 0.0 02/20/2015 1031   EOSABS 0.1 02/18/2014 1412   EOSABS 0.0 12/07/2012 0814   BASOSABS 0.0 02/20/2015 1031   BASOSABS 0.0 12/07/2012 1610  Hgb A1C No results found for: HGBA1C      Assessment & Plan:   Preventative Health Maintenance:  Encouraged her to get a flu shot in the fall She declines tetanus today Zostovax UTD Mammogram and Pap Smear UTD She declines bone density exam She declines colon cancer screening Will order CBC, CMET, Lipid, Hep C, HIV and Vit D today Encouraged her to see an eye doctor and dentist at least annually Encouraged her to consume a balanced diet and exercise regimen  Menopausal symptoms:  Discussed treatment with non-hormonal therapies She is willing to try Effexor, RX sent to pharmacy  Smoker:  Cessation counseling given She will continue to try to wean down and quit over the next year   RTC in 1 year or sooner if needed Nicki ReaperBAITY, Rylan Bernard, NP

## 2016-03-18 NOTE — Addendum Note (Signed)
Addended by: Lorre MunroeBAITY, Ares Cardozo W on: 03/18/2016 02:21 PM   Modules accepted: Kipp BroodSmartSet

## 2016-03-18 NOTE — Addendum Note (Signed)
Addended by: Lorre MunroeBAITY, REGINA W on: 03/18/2016 02:19 PM   Modules accepted: Orders, SmartSet

## 2016-03-18 NOTE — Assessment & Plan Note (Addendum)
Controlled on Lisinopril She declines the ECG today Will monitor

## 2016-03-18 NOTE — Patient Instructions (Signed)

## 2016-03-19 LAB — LIPID PANEL
Chol/HDL Ratio: 2.4 ratio units (ref 0.0–4.4)
Cholesterol, Total: 141 mg/dL (ref 100–199)
HDL: 59 mg/dL (ref >39)
LDL CALC: 69 mg/dL (ref 0–99)
Triglycerides: 65 mg/dL (ref 0–149)
VLDL CHOLESTEROL CAL: 13 mg/dL (ref 5–40)

## 2016-03-19 LAB — COMPREHENSIVE METABOLIC PANEL
ALBUMIN: 4.7 g/dL (ref 3.5–5.5)
ALK PHOS: 62 IU/L (ref 39–117)
ALT: 13 IU/L (ref 0–32)
AST: 18 IU/L (ref 0–40)
Albumin/Globulin Ratio: 2 (ref 1.2–2.2)
BILIRUBIN TOTAL: 0.5 mg/dL (ref 0.0–1.2)
BUN/Creatinine Ratio: 9 (ref 9–23)
BUN: 7 mg/dL (ref 6–24)
CHLORIDE: 96 mmol/L (ref 96–106)
CO2: 23 mmol/L (ref 18–29)
Calcium: 9.8 mg/dL (ref 8.7–10.2)
Creatinine, Ser: 0.79 mg/dL (ref 0.57–1.00)
GFR calc non Af Amer: 86 mL/min/{1.73_m2} (ref >59)
GFR, EST AFRICAN AMERICAN: 99 mL/min/{1.73_m2} (ref >59)
GLOBULIN, TOTAL: 2.3 g/dL (ref 1.5–4.5)
GLUCOSE: 98 mg/dL (ref 65–99)
Potassium: 4.4 mmol/L (ref 3.5–5.2)
SODIUM: 137 mmol/L (ref 134–144)
TOTAL PROTEIN: 7 g/dL (ref 6.0–8.5)

## 2016-03-19 LAB — HIV ANTIBODY (ROUTINE TESTING W REFLEX): HIV SCREEN 4TH GENERATION: NONREACTIVE

## 2016-03-19 LAB — CBC
HEMATOCRIT: 42 % (ref 34.0–46.6)
HEMOGLOBIN: 14.2 g/dL (ref 11.1–15.9)
MCH: 33.4 pg — AB (ref 26.6–33.0)
MCHC: 33.8 g/dL (ref 31.5–35.7)
MCV: 99 fL — AB (ref 79–97)
Platelets: 302 10*3/uL (ref 150–379)
RBC: 4.25 x10E6/uL (ref 3.77–5.28)
RDW: 12.3 % (ref 12.3–15.4)
WBC: 7.6 10*3/uL (ref 3.4–10.8)

## 2016-03-19 LAB — VITAMIN D 25 HYDROXY (VIT D DEFICIENCY, FRACTURES): VIT D 25 HYDROXY: 35.6 ng/mL (ref 30.0–100.0)

## 2016-03-19 LAB — HEPATITIS C ANTIBODY

## 2016-03-30 ENCOUNTER — Telehealth: Payer: Self-pay | Admitting: Internal Medicine

## 2016-03-30 DIAGNOSIS — Z131 Encounter for screening for diabetes mellitus: Secondary | ICD-10-CM

## 2016-03-30 NOTE — Telephone Encounter (Signed)
Patient is asking for Shawna Orleans to call her back about a form that was to be sent to patient's insurance showing that patient had her wellness exam done and the results.  The form was to be faxed to the insurance and a copy mailed to patient.  Please call patient back about form.

## 2016-03-30 NOTE — Telephone Encounter (Signed)
Pt will need to come in for Hem A!C---will place original form in front office for pt to pick up and keep copy waiting for the A1C result pt is aware

## 2016-03-31 ENCOUNTER — Other Ambulatory Visit (INDEPENDENT_AMBULATORY_CARE_PROVIDER_SITE_OTHER): Payer: 59

## 2016-03-31 DIAGNOSIS — Z131 Encounter for screening for diabetes mellitus: Secondary | ICD-10-CM

## 2016-04-02 LAB — HEMOGLOBIN A1C
Est. average glucose Bld gHb Est-mCnc: 108 mg/dL
HEMOGLOBIN A1C: 5.4 % (ref 4.8–5.6)

## 2016-04-26 ENCOUNTER — Telehealth: Payer: Self-pay | Admitting: *Deleted

## 2016-04-26 NOTE — Telephone Encounter (Signed)
PT came in to drop off form to be [facility]. Please call her when it is ready. [phone]. Form placed in prescription tower.

## 2016-04-29 DIAGNOSIS — Z7689 Persons encountering health services in other specified circumstances: Secondary | ICD-10-CM

## 2016-04-29 NOTE — Telephone Encounter (Signed)
Will make a copy for charg and scan then place in front office for pick up--- Left detailed msg on VM per HIPAA

## 2016-05-18 ENCOUNTER — Ambulatory Visit (INDEPENDENT_AMBULATORY_CARE_PROVIDER_SITE_OTHER): Payer: 59 | Admitting: Internal Medicine

## 2016-05-18 ENCOUNTER — Encounter: Payer: Self-pay | Admitting: Internal Medicine

## 2016-05-18 VITALS — BP 128/82 | HR 100 | Temp 98.8°F | Wt 135.0 lb

## 2016-05-18 DIAGNOSIS — J329 Chronic sinusitis, unspecified: Secondary | ICD-10-CM

## 2016-05-18 DIAGNOSIS — B349 Viral infection, unspecified: Secondary | ICD-10-CM

## 2016-05-18 DIAGNOSIS — B9789 Other viral agents as the cause of diseases classified elsewhere: Secondary | ICD-10-CM

## 2016-05-18 MED ORDER — PREDNISONE 10 MG PO TABS: ORAL_TABLET | ORAL | 0 refills | Status: DC

## 2016-05-18 MED ORDER — HYDROCODONE-HOMATROPINE 5-1.5 MG/5ML PO SYRP: 5.0000 mL | ORAL_SOLUTION | Freq: Three times a day (TID) | ORAL | 0 refills | Status: DC | PRN

## 2016-05-18 NOTE — Patient Instructions (Signed)

## 2016-05-18 NOTE — Progress Notes (Signed)
HPI  Pt presents to the clinic today with c/o facial pain and pressure, ear fullness, nasal congestion, post nasal drip and cough. This started 2 weeks after travelling out of town to [facility]. She reports during that time, she was not taking her Allegra. She denies decreased hearing. She is blowing clear mucous out of her nose. She denies difficulty swallowing. The cough is productive of clear mucous. She denies shortness of breath. She denies fever, chills or body aches. She has taken Mucinex, Tylenol and Coricidin D without any relief. She has not had sick contacts that she is aware of.  Review of Systems    Past Medical History:  Diagnosis Date  . 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 Chicken pox   . Kidney stones 2013  . Mammographic microcalcification 2013  . Pregnancy examination or test, pregnancy unconfirmed 2014  . Shingles June 2015    Family History  Problem Relation Age of Onset  . Hyperlipidemia Father   . Heart disease Father   . Hypertension Father   . Stroke Paternal Grandmother   . Cancer Neg Hx     Social History   Social History  . Marital status: Married    Spouse name: N/A  . Number of children: N/A  . Years of education: N/A   Occupational History  . Not on file.   Social History Main Topics  . Smoking status: Current Every Day Smoker    Packs/day: 0.50    Years: 28.00    Types: Cigarettes  . Smokeless tobacco: Never Used  . Alcohol use 3.6 oz/week    6 Glasses of wine per week     Comment: beer or wine---occasional  . Drug use: No  . Sexual activity: Yes   Other Topics Concern  . Not on file   Social History Narrative  . No narrative on file    No Known Allergies   Constitutional: Positive headache. Denies fatigue, fever or abrupt weight changes.  HEENT:  Positive facial pain, nasal congestion and sore throat. Denies eye redness, ear pain, ringing in the ears,  wax buildup, runny nose or bloody nose. Respiratory: Positive cough. Denies difficulty breathing or shortness of breath.  Cardiovascular: Denies chest pain, chest tightness, palpitations or swelling in the hands or feet.   No other specific complaints in a complete review of systems (except as listed in HPI above).  Objective:   BP 128/82   Pulse 100   Temp 98.8 F (37.1 C) (Oral)   Wt 135 lb (61.2 kg)   SpO2 97%   BMI 21.57 kg/m   General: Appears her stated age, well developed, well nourished in NAD. HEENT: Head: normal shape and size, maxillary sinus tenderness noted; Eyes: sclera white, no icterus, conjunctiva pink; Ears: Tm's gray and intact, normal light reflex, + serous effusion R>L; Nose: mucosa boggy and moist, septum midline; Throat/Mouth: + PND. Teeth present, mucosa pink and moist, no exudate noted, no lesions or ulcerations noted.  Neck:  No adenopathy noted.  Cardiovascular: Normal rate and rhythm. S1,S2 noted.  No murmur, rubs or gallops noted.  Pulmonary/Chest: Normal effort and inspiratory wheezing in the RUL, RML, RLL. No respiratory distress. No rales or ronchi noted.      Assessment & Plan:   Viral sinusitis:  Can use a Neti Pot which can be purchased from your local drug store. Continue Allegra eRx for Pred Taper x 6  days RX for Hycodan for cough  RTC as needed or if symptoms persist. Nicki ReaperBAITY, Kevionna Heffler, NP

## 2016-08-09 ENCOUNTER — Telehealth: Payer: Self-pay

## 2016-08-09 NOTE — Telephone Encounter (Signed)
The [facility] will [facility].

## 2016-08-11 ENCOUNTER — Other Ambulatory Visit: Payer: Self-pay

## 2016-08-11 DIAGNOSIS — D0511 Intraductal carcinoma in situ of right breast: Secondary | ICD-10-CM

## 2016-10-25 ENCOUNTER — Ambulatory Visit
Admission: RE | Admit: 2016-10-25 | Discharge: 2016-10-25 | Disposition: A | Discharge status: Home / Self Care | Payer: 59 | Source: Ambulatory Visit | Attending: General Surgery | Admitting: General Surgery

## 2016-10-25 DIAGNOSIS — D0511 Intraductal carcinoma in situ of right breast: Secondary | ICD-10-CM

## 2016-10-25 DIAGNOSIS — R921 Mammographic calcification found on diagnostic imaging of breast: Secondary | ICD-10-CM | POA: Diagnosis not present

## 2016-10-25 DIAGNOSIS — Z853 Personal history of malignant neoplasm of breast: Secondary | ICD-10-CM | POA: Diagnosis not present

## 2016-10-25 DIAGNOSIS — R922 Inconclusive mammogram: Secondary | ICD-10-CM | POA: Diagnosis not present

## 2016-10-25 HISTORY — DX: Personal history of irradiation: Z92.3

## 2016-11-01 ENCOUNTER — Encounter: Payer: Self-pay | Admitting: General Surgery

## 2016-11-01 ENCOUNTER — Ambulatory Visit (INDEPENDENT_AMBULATORY_CARE_PROVIDER_SITE_OTHER): Payer: 59 | Admitting: General Surgery

## 2016-11-01 VITALS — BP 130/82 | HR 74 | Resp 12 | Ht 67.0 in | Wt 140.0 lb

## 2016-11-01 DIAGNOSIS — D0511 Intraductal carcinoma in situ of right breast: Secondary | ICD-10-CM

## 2016-11-01 NOTE — Progress Notes (Signed)
Patient ID: Jane Gonzales, female   DOB: 1961/01/23, 56 y.o.   MRN: [id number]  Chief Complaint  Patient presents with  . Follow-up    HPI Jane Gonzales is a 56 y.o. female who presents for a breast cancer follow up.The most recent mammogram was done on 10/25/2016. Patient is doing better on the Tamoxifen since taking the Effexor .  Patient does perform regular self breast checks and gets regular mammograms done.   I have reviewed the history of present illness with the patient.  aHPI  Past Medical History:  Diagnosis Date  . 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 Chicken pox   . Kidney stones 2013  . Mammographic microcalcification 2013  . Personal history of radiation therapy 2014   BREAST CA  . Pregnancy examination or test, pregnancy unconfirmed 2014  . Shingles June 2015    Past Surgical History:  Procedure Laterality Date  . BREAST LUMPECTOMY WITH SENTINEL LYMPH NODE BIOPSY Right 09/13/2012  . KNEE SURGERY  2007   ACL  . LAPAROSCOPY  1988  . LITHOTRIPSY  1992    Family History  Problem Relation Age of Onset  . Hyperlipidemia Father   . Heart disease Father   . Hypertension Father   . Stroke Paternal Grandmother   . Cancer Neg Hx   . Breast cancer Neg Hx     Social History Social History  Substance Use Topics  . Smoking status: Current Every Day Smoker    Packs/day: 0.50    Years: 28.00    Types: Cigarettes  . Smokeless tobacco: Never Used  . Alcohol use 3.6 oz/week    6 Glasses of wine per week     Comment: beer or wine---occasional    No Known Allergies  Current Outpatient Prescriptions  Medication Sig Dispense Refill  . Ascorbic Acid (VITAMIN C) 1000 MG tablet Take 1,000 mg by mouth daily.    Marland Kitchen. HYDROcodone-homatropine (HYCODAN) 5-1.5 MG/5ML syrup Take 5 mLs by mouth every 8 (eight) hours as needed for cough. 120 mL 0  . lisinopril (PRINIVIL,ZESTRIL) 20 MG tablet Take 1 tablet (20  mg total) by mouth daily. 90 tablet 3  . predniSONE (DELTASONE) 10 MG tablet Take 3 tabs on days 1-2, take 2 tabs on days 3-4, take 1 tab on days 5-6 12 tablet 0  . tamoxifen (NOLVADEX) 20 MG tablet Take 1 tablet by mouth  daily 90 tablet 3  . venlafaxine XR (EFFEXOR-XR) 37.5 MG 24 hr capsule Take 1 capsule (37.5 mg total) by mouth daily with breakfast. 90 capsule 3   No current facility-administered medications for this visit.     Review of Systems Review of Systems  Constitutional: Negative.   Respiratory: Negative.   Cardiovascular: Negative.     Blood pressure 130/82, pulse 74, resp. rate 12, height 5\' 7"  (1.702 m), weight 140 lb (63.5 kg), last menstrual period 05/20/2015.  Physical Exam Physical Exam  Constitutional: She is oriented to person, place, and time. She appears well-developed and well-nourished.  Eyes: Conjunctivae are normal. No scleral icterus.  Neck: Neck supple.  Cardiovascular: Normal rate, regular rhythm and normal heart sounds.   Pulmonary/Chest: Effort normal and breath sounds normal. Right breast exhibits no inverted nipple, no mass, no nipple discharge, no skin change and no tenderness. Left breast exhibits no inverted nipple, no mass, no nipple discharge, no skin change and no tenderness.  Abdominal: Soft. Bowel sounds  are normal. There is no tenderness.  Lymphadenopathy:    She has no cervical adenopathy.    She has no axillary adenopathy.  Neurological: She is alert and oriented to person, place, and time.  Skin: Skin is warm and dry.    Data Reviewed Mammogram reviewed   Assessment    Stable physical exam. Right breast DCIS, 74yrs s/p lumpectomy and radiation, continuing Tamoxifen    Plan    The patient has been asked to return to the office in one year with a bilateral diagnostic mammogram with Dr. Ileene Patrick.  This information has been scribed by Ples Specter CMA.       Haleem Hanner G 11/02/2016, 9:07 AM

## 2016-11-01 NOTE — Patient Instructions (Signed)
The patient has been asked to return to the office in one year with a bilateral diagnostic mammogram with Dr. Ileene PatrickByrentt.

## 2016-12-09 ENCOUNTER — Other Ambulatory Visit: Payer: Self-pay | Admitting: General Surgery

## 2017-01-07 ENCOUNTER — Other Ambulatory Visit: Payer: Self-pay | Admitting: Internal Medicine

## 2017-01-07 DIAGNOSIS — N951 Menopausal and female climacteric states: Secondary | ICD-10-CM

## 2017-01-10 NOTE — Telephone Encounter (Signed)
CPE reminder mailed to pt 

## 2017-01-11 MED ORDER — VENLAFAXINE HCL ER 37.5 MG PO CP24: ORAL_CAPSULE | ORAL | 0 refills | Status: DC

## 2017-02-10 ENCOUNTER — Ambulatory Visit (INDEPENDENT_AMBULATORY_CARE_PROVIDER_SITE_OTHER)
Admission: RE | Admit: 2017-02-10 | Discharge: 2017-02-10 | Disposition: A | Discharge status: Home / Self Care | Payer: 59 | Source: Ambulatory Visit | Attending: Internal Medicine | Admitting: Internal Medicine

## 2017-02-10 ENCOUNTER — Encounter: Payer: Self-pay | Admitting: Internal Medicine

## 2017-02-10 ENCOUNTER — Ambulatory Visit (INDEPENDENT_AMBULATORY_CARE_PROVIDER_SITE_OTHER): Payer: 59 | Admitting: Internal Medicine

## 2017-02-10 ENCOUNTER — Other Ambulatory Visit: Payer: Self-pay | Admitting: Internal Medicine

## 2017-02-10 VITALS — BP 128/78 | HR 97 | Temp 98.2°F | Wt 143.8 lb

## 2017-02-10 DIAGNOSIS — M79675 Pain in left toe(s): Secondary | ICD-10-CM | POA: Diagnosis not present

## 2017-02-10 DIAGNOSIS — M7989 Other specified soft tissue disorders: Secondary | ICD-10-CM

## 2017-02-10 MED ORDER — FUROSEMIDE 20 MG PO TABS: 20.0000 mg | ORAL_TABLET | Freq: Every day | ORAL | 0 refills | Status: DC

## 2017-02-10 NOTE — Progress Notes (Signed)
Subjective:    Patient ID: Jane Gonzales, female    DOB: [birth year], 57 y.o.   MRN: [id number]  HPI  Pt presents to the clinic today with c/o pain and swelling of her left foot. She reports this started 1 week ago. She denies any injury to the area. The pain is located on the top of her foot, next to her toes. She describes the pain as sore. She denies redness or warmth. She has tried Tylenol, Ibuprofen and Ice with minimal relief. She has been running more on the treadmill recently and is not sure if this is a contributing factor.  Review of Systems      Past Medical History:  Diagnosis Date  . 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 Chicken pox   . Kidney stones 2013  . Mammographic microcalcification 2013  . Personal history of radiation therapy 2014   BREAST CA  . Pregnancy examination or test, pregnancy unconfirmed 2014  . Shingles June 2015    Current Outpatient Prescriptions  Medication Sig Dispense Refill  . Ascorbic Acid (VITAMIN C) 1000 MG tablet Take 1,000 mg by mouth daily.    Marland Kitchen lisinopril (PRINIVIL,ZESTRIL) 20 MG tablet Take 1 tablet (20 mg total) by mouth daily. 90 tablet 3  . tamoxifen (NOLVADEX) 20 MG tablet TAKE 1 TABLET BY MOUTH  DAILY 90 tablet 4  . venlafaxine XR (EFFEXOR-XR) 37.5 MG 24 hr capsule TAKE 1 CAPSULE BY MOUTH  DAILY WITH BREAKFAST 90 capsule 0   No current facility-administered medications for this visit.     No Known Allergies  Family History  Problem Relation Age of Onset  . Hyperlipidemia Father   . Heart disease Father   . Hypertension Father   . Stroke Paternal Grandmother   . Cancer Neg Hx   . Breast cancer Neg Hx     Social History   Social History  . Marital status: Married    Spouse name: N/A  . Number of children: N/A  . Years of education: N/A   Occupational History  . Not on file.   Social History Main Topics  . Smoking status: Current Every Day  Smoker    Packs/day: 0.50    Years: 28.00    Types: Cigarettes  . Smokeless tobacco: Never Used  . Alcohol use 3.6 oz/week    6 Glasses of wine per week     Comment: beer or wine---occasional  . Drug use: No  . Sexual activity: Yes   Other Topics Concern  . Not on file   Social History Narrative  . No narrative on file     Constitutional: Denies fever, malaise, fatigue, headache or abrupt weight changes.  Musculoskeletal: Pt reports left foot swelling and pain. Denies decrease in range of motion, muscle pain.  Skin: Denies redness, rashes, lesions or ulcercations.    No other specific complaints in a complete review of systems (except as listed in HPI above).  Objective:   Physical Exam   BP 128/78   Pulse 97   Temp 98.2 F (36.8 C) (Oral)   Wt 143 lb 12 oz (65.2 kg)   LMP 05/20/2015   SpO2 97%   BMI 22.51 kg/m  Wt Readings from Last 3 Encounters:  02/10/17 143 lb 12 oz (65.2 kg)  11/01/16 140 lb (63.5 kg)  05/18/16 135 lb (61.2 kg)    General: Appears her stated age, in NAD.  Skin: Warm, dry and intact. No redness or warmth noted. Musculoskeletal: Normal flexion, extension and rotation noted of the left ankle. Swelling noted from tips of toes all the way back to her mid foot. Pedal pulse intact. Sensation intact. She is able to walk on heels. She can not walk on toes. She limps with normal gait.  BMET    Component Value Date/Time   NA 137 03/18/2016 1425   K 4.4 03/18/2016 1425   CL 96 03/18/2016 1425   CO2 23 03/18/2016 1425   GLUCOSE 98 03/18/2016 1425   BUN 7 03/18/2016 1425   CREATININE 0.79 03/18/2016 1425   CALCIUM 9.8 03/18/2016 1425   GFRNONAA 86 03/18/2016 1425   GFRAA 99 03/18/2016 1425    Lipid Panel     Component Value Date/Time   CHOL 141 03/18/2016 1425   TRIG 65 03/18/2016 1425   HDL 59 03/18/2016 1425   CHOLHDL 2.4 03/18/2016 1425   LDLCALC 69 03/18/2016 1425    CBC    Component Value Date/Time   WBC 7.6 03/18/2016 1425    WBC 9.2 12/07/2012 0814   RBC 4.25 03/18/2016 1425   RBC 4.62 12/07/2012 0814   HGB 14.2 03/18/2016 1425   HCT 42.0 03/18/2016 1425   PLT 302 03/18/2016 1425   MCV 99 (H) 03/18/2016 1425   MCV 100 12/07/2012 0814   MCH 33.4 (H) 03/18/2016 1425   MCH 33.4 12/07/2012 0814   MCHC 33.8 03/18/2016 1425   MCHC 33.5 12/07/2012 0814   RDW 12.3 03/18/2016 1425   RDW 12.5 12/07/2012 0814   LYMPHSABS 1.2 02/20/2015 1031   LYMPHSABS 0.7 (L) 12/07/2012 0814   MONOABS 0.7 12/07/2012 0814   EOSABS 0.0 02/20/2015 1031   EOSABS 0.0 12/07/2012 0814   BASOSABS 0.0 02/20/2015 1031   BASOSABS 0.0 12/07/2012 0814    Hgb A1C Lab Results  Component Value Date   HGBA1C 5.4 03/31/2016           Assessment & Plan:   Left Foot Pain and Swelling:  Will obtain xray of left foot today Information given on RICE therapy She may need a post op shoe, pending xray  Will call you with xray results, return precautions discussed Nicki ReaperBAITY, REGINA, NP

## 2017-02-10 NOTE — Patient Instructions (Signed)
RICE for Routine Care of Injuries Many injuries can be cared for using rest, ice, compression, and elevation (RICE therapy). Using RICE therapy can help to lessen pain and swelling. It can help your body to heal. Rest Reduce your normal activities and avoid using the injured part of your body. You can go back to your normal activities when you feel okay and your doctor says it is okay. Ice Do not put ice on your bare skin.  Put ice in a plastic bag.  Place a towel between your skin and the bag.  Leave the ice on for 20 minutes, 2-3 times a day.  Do this for as [place] as told by your doctor. Compression Compression means putting pressure on the injured area. This can be done with an elastic bandage. If an elastic bandage has been applied:  Remove and reapply the bandage every 3-4 hours or as told by your doctor.  Make sure the bandage is not wrapped too tight. Wrap the bandage more loosely if part of your body beyond the bandage is blue, swollen, cold, painful, or loses feeling (numb).  See your doctor if the bandage seems to make your problems worse.  Elevation Elevation means keeping the injured area raised. Raise the injured area above your heart or the center of your chest if you can. When should I get help? You should get help if:  You keep having pain and swelling.  Your symptoms get worse.  Get help right away if: You should get help right away if:  You have sudden bad pain at or below the area of your injury.  You have redness or more swelling around your injury.  You have tingling or numbness at or below the injury that does not go away when you take off the bandage.  This information is not intended to replace advice given to you by your health care provider. Make sure you discuss any questions you have with your health care provider. Document Released: 02/02/2008 Document Revised: 07/13/2016 Document Reviewed: 07/24/2014 Elsevier Interactive Patient Education  2017  [employer]  

## 2017-02-14 ENCOUNTER — Telehealth: Payer: Self-pay

## 2017-02-14 NOTE — Telephone Encounter (Signed)
Pt left v/m; pt seen 02/10/17 and pt was started on Lasix and pt was to cb about swelling in foot. Pt was out of town over weekend and difficult to keep foot up. Pt still has swelling in foot by lunch time each day. Swelling in foot does go down overnight when feet are in bed. Pt request cb.

## 2017-02-15 ENCOUNTER — Other Ambulatory Visit: Payer: Self-pay | Admitting: Internal Medicine

## 2017-02-15 MED ORDER — FUROSEMIDE 20 MG PO TABS: 20.0000 mg | ORAL_TABLET | Freq: Every day | ORAL | 0 refills | Status: DC

## 2017-02-15 NOTE — Telephone Encounter (Signed)
Pt reports that she wore sneakers today and there was some improvement... She wants to know if she can have some more Lasix and hold off on hose for now... Please advise

## 2017-02-15 NOTE — Telephone Encounter (Signed)
She is either going to have to elevate her feet or I can give her a RX for compression hose.

## 2017-02-15 NOTE — Telephone Encounter (Signed)
Left detailed msg on VM per HIPAA  

## 2017-02-15 NOTE — Telephone Encounter (Signed)
So did the Lasix not make any difference?

## 2017-02-15 NOTE — Telephone Encounter (Signed)
Patient returned Melanie's call.  Patient said the Lasix has helped.  Patient's foot looks good in the morning, but is swollen by the afternoon because she can't elevate her foot at work. Patient can be reached [phone].

## 2017-02-15 NOTE — Telephone Encounter (Signed)
I will send her in another 5 day supply

## 2017-02-17 MED ORDER — FUROSEMIDE 20 MG PO TABS: 20.0000 mg | ORAL_TABLET | Freq: Every day | ORAL | 0 refills | Status: DC

## 2017-02-17 NOTE — Addendum Note (Signed)
Addended by: Roena MaladyEVONTENNO, Joeli Fenner Y on: 02/17/2017 08:03 AM   Modules accepted: Orders

## 2017-02-17 NOTE — Telephone Encounter (Signed)
Left detailed msg on VM per HIPAA  

## 2017-03-14 ENCOUNTER — Ambulatory Visit (INDEPENDENT_AMBULATORY_CARE_PROVIDER_SITE_OTHER): Payer: 59 | Admitting: Family Medicine

## 2017-03-14 ENCOUNTER — Encounter: Payer: Self-pay | Admitting: Family Medicine

## 2017-03-14 VITALS — BP 138/82 | HR 110 | Temp 98.4°F | Wt 141.8 lb

## 2017-03-14 DIAGNOSIS — M7989 Other specified soft tissue disorders: Secondary | ICD-10-CM

## 2017-03-14 MED ORDER — COLCHICINE 0.6 MG PO TABS: 0.6000 mg | ORAL_TABLET | Freq: Every day | ORAL | 0 refills | Status: DC | PRN

## 2017-03-14 MED ORDER — PREDNISONE 20 MG PO TABS: ORAL_TABLET | ORAL | 0 refills | Status: DC

## 2017-03-14 NOTE — Assessment & Plan Note (Addendum)
Story, exam consistent with acute flare. Discussed pathophysiology. Not consistent with DVT or with injury. Check labs today.  Treat with prednisone 10d taper, provided with colchicine to use PRN future gout flares. Discussed prevention strategies.  Update if not improving with treatment.  Pt agrees with plan. Handout provided. Declines post-op shoe

## 2017-03-14 NOTE — Patient Instructions (Addendum)
This sounds and looks like gout. Treat with prednisone taper sent to pharmacy. Labs today.  If you have another flare, take colchicine (printed prescription).  If recurrent, we should talk about daily gout prevention medicine.   Gout Gout is painful swelling that can occur in some of your joints. Gout is a type of arthritis. This condition is caused by having too much uric acid in your body. Uric acid is a chemical that forms when your body breaks down substances called purines. Purines are important for building body proteins. When your body has too much uric acid, sharp crystals can form and build up inside your joints. This causes pain and swelling. Gout attacks can happen quickly and be very painful (acute gout). Over time, the attacks can affect more joints and become more frequent (chronic gout). Gout can also cause uric acid to build up under your skin and inside your kidneys. What are the causes? This condition is caused by too much uric acid in your blood. This can occur because:  Your kidneys do not remove enough uric acid from your blood. This is the most common cause.  Your body makes too much uric acid. This can occur with some cancers and cancer treatments. It can also occur if your body is breaking down too many red blood cells (hemolytic anemia).  You eat too many foods that are high in purines. These foods include organ meats and some seafood. Alcohol, especially beer, is also high in purines.  A gout attack may be triggered by trauma or stress. What increases the risk? This condition is more likely to develop in people who:  Have a family history of gout.  Are female and middle-aged.  Are female and have gone through menopause.  Are obese.  Frequently drink alcohol, especially beer.  Are dehydrated.  Lose weight too quickly.  Have an organ transplant.  Have lead poisoning.  Take certain medicines, including aspirin, cyclosporine, diuretics, levodopa, and  niacin.  Have kidney disease or psoriasis.  What are the signs or symptoms? An attack of acute gout happens quickly. It usually occurs in just one joint. The most common place is the big toe. Attacks often start at night. Other joints that may be affected include joints of the feet, ankle, knee, fingers, wrist, or elbow. Symptoms may include:  Severe pain.  Warmth.  Swelling.  Stiffness.  Tenderness. The affected joint may be very painful to touch.  Shiny, red, or purple skin.  Chills and fever.  Chronic gout may cause symptoms more frequently. More joints may be involved. You may also have white or yellow lumps (tophi) on your hands or feet or in other areas near your joints. How is this diagnosed? This condition is diagnosed based on your symptoms, medical history, and physical exam. You may have tests, such as:  Blood tests to measure uric acid levels.  Removal of joint fluid with a needle (aspiration) to look for uric acid crystals.  X-rays to look for joint damage.  How is this treated? Treatment for this condition has two phases: treating an acute attack and preventing future attacks. Acute gout treatment may include medicines to reduce pain and swelling, including:  NSAIDs.  Steroids. These are strong anti-inflammatory medicines that can be taken by mouth (orally) or injected into a joint.  Colchicine. This medicine relieves pain and swelling when it is taken soon after an attack. It can be given orally or through an IV tube.  Preventive treatment may include:  Daily use of smaller doses of NSAIDs or colchicine.  Use of a medicine that reduces uric acid levels in your blood.  Changes to your diet. You may need to see a specialist about healthy eating (dietitian).  Follow these instructions at home: During a Gout Attack  If directed, apply ice to the affected area: ? Put ice in a plastic bag. ? Place a towel between your skin and the bag. ? Leave the ice on  for 20 minutes, 2-3 times a day.  Rest the joint as much as possible. If the affected joint is in your leg, you may be given crutches to use.  Raise (elevate) the affected joint above the level of your heart as often as possible.  Drink enough fluids to keep your urine clear or pale yellow.  Take over-the-counter and prescription medicines only as told by your health care provider.  Do not drive or operate heavy machinery while taking prescription pain medicine.  Follow instructions from your health care provider about eating or drinking restrictions.  Return to your normal activities as told by your health care provider. Ask your health care provider what activities are safe for you. Avoiding Future Gout Attacks  Follow a low-purine diet as told by your dietitian or health care provider. Avoid foods and drinks that are high in purines, including liver, kidney, anchovies, asparagus, herring, mushrooms, mussels, and beer.  Limit alcohol intake to no more than 1 drink a day for nonpregnant women and 2 drinks a day for men. One drink equals 12 oz of beer, 5 oz of wine, or 1 oz of hard liquor.  Maintain a healthy weight or lose weight if you are overweight. If you want to lose weight, talk with your health care provider. It is important that you do not lose weight too quickly.  Start or maintain an exercise program as told by your health care provider.  Drink enough fluids to keep your urine clear or pale yellow.  Take over-the-counter and prescription medicines only as told by your health care provider.  Keep all follow-up visits as told by your health care provider. This is important. Contact a health care provider if:  You have another gout attack.  You continue to have symptoms of a gout attack after10 days of treatment.  You have side effects from your medicines.  You have chills or a fever.  You have burning pain when you urinate.  You have pain in your lower back or  belly. Get help right away if:  You have severe or uncontrolled pain.  You cannot urinate. This information is not intended to replace advice given to you by your health care provider. Make sure you discuss any questions you have with your health care provider. Document Released: 08/13/2000 Document Revised: 01/22/2016 Document Reviewed: 05/29/2015 Elsevier Interactive Patient Education  2017 [employer]

## 2017-03-14 NOTE — Progress Notes (Signed)
BP 138/82   Pulse (!) 110   Temp 98.4 F (36.9 C) (Oral)   Wt 141 lb 12 oz (64.3 kg)   LMP 05/20/2015   SpO2 97%   BMI 22.20 kg/m    CC: R foot swelling Subjective:    Patient ID: Jane Gonzales, female    DOB: 30-Apr-1961, 56 y.o.   MRN: [id number]  HPI: Jane Gonzales is a 56 y.o. female presenting on 03/14/2017 for Foot Swelling (right. Denies injury)   R dorsal foot swelling for last 2.5 wks. Denies inciting trauma/injury or falls. + redness, warmth, pain, swelling. Worse pain in the mornings. Tried ice, heat, elevation, soak in epsom salts. She has been treating with ibuprofen, tylenol.   She has recently had more fried gizzard and shrimp and beer.  No fevers/chills, nausea, rash.  No h/o gout.   Seen last month by PCP with L foot swelling - xrays without acute finding. Treated with leg elevation and short course of water pill. This improved.   H/o R breast DCIS treated with lumpectomy, on tamoxifen.  Smoking - 1/2 ppd.   Relevant past medical, surgical, family and social history reviewed and updated as indicated. Interim medical history since our last visit reviewed. Allergies and medications reviewed and updated. Outpatient Medications Prior to Visit  Medication Sig Dispense Refill  . Ascorbic Acid (VITAMIN C) 1000 MG tablet Take 1,000 mg by mouth daily.    Marland Kitchen lisinopril (PRINIVIL,ZESTRIL) 20 MG tablet Take 1 tablet (20 mg total) by mouth daily. 90 tablet 3  . tamoxifen (NOLVADEX) 20 MG tablet TAKE 1 TABLET BY MOUTH  DAILY 90 tablet 4  . venlafaxine XR (EFFEXOR-XR) 37.5 MG 24 hr capsule TAKE 1 CAPSULE BY MOUTH  DAILY WITH BREAKFAST 90 capsule 0  . furosemide (LASIX) 20 MG tablet Take 1 tablet (20 mg total) by mouth daily. 5 tablet 0   No facility-administered medications prior to visit.      Per HPI unless specifically indicated in ROS section below Review of Systems     Objective:    BP 138/82   Pulse (!) 110   Temp 98.4 F (36.9 C) (Oral)   Wt 141 lb 12 oz  (64.3 kg)   LMP 05/20/2015   SpO2 97%   BMI 22.20 kg/m   Wt Readings from Last 3 Encounters:  03/14/17 141 lb 12 oz (64.3 kg)  02/10/17 143 lb 12 oz (65.2 kg)  11/01/16 140 lb (63.5 kg)    Physical Exam  Constitutional: She appears well-developed and well-nourished. No distress.  Musculoskeletal: She exhibits edema.  L foot WNL R foot - warmth, pain, swelling present distal dorsal foot with swelling extending to ankle  Skin: Skin is warm and dry. There is erythema.  Nursing note and vitals reviewed.  Results for orders placed or performed in visit on 03/31/16  Hemoglobin A1c  Result Value Ref Range   Hgb A1c MFr Bld 5.4 4.8 - 5.6 %   Est. average glucose Bld gHb Est-mCnc 108 mg/dL   Lab Results  Component Value Date   CREATININE 0.79 03/18/2016       Assessment & Plan:   Problem List Items Addressed This Visit    Foot swelling - Primary    Story, exam consistent with acute flare. Discussed pathophysiology. Not consistent with DVT or with injury. Check labs today.  Treat with prednisone 10d taper, provided with colchicine to use PRN future gout flares. Discussed prevention strategies.  Update if not improving  with treatment.  Pt agrees with plan. Handout provided. Declines post-op shoe      Relevant Orders   CBC with Differential/Platelet   Basic metabolic panel   Uric acid   Sedimentation rate       Follow up plan: Return if symptoms worsen or fail to improve.  Eustaquio BoydenJavier Blakleigh Straw, MD

## 2017-03-15 LAB — BASIC METABOLIC PANEL
BUN: 14 mg/dL (ref 6–23)
CHLORIDE: 104 meq/L (ref 96–112)
CO2: 26 mEq/L (ref 19–32)
CREATININE: 0.72 mg/dL (ref 0.40–1.20)
Calcium: 10.1 mg/dL (ref 8.4–10.5)
GFR: 89.69 mL/min (ref >60.00)
Glucose, Bld: 104 mg/dL — ABNORMAL HIGH (ref 70–99)
Potassium: 4.5 mEq/L (ref 3.5–5.1)
SODIUM: 138 meq/L (ref 135–145)

## 2017-03-15 LAB — URIC ACID: Uric Acid, Serum: 5.1 mg/dL (ref 2.4–7.0)

## 2017-03-15 LAB — SEDIMENTATION RATE: SED RATE: 10 mm/h (ref 0–30)

## 2017-03-15 LAB — CBC WITH DIFFERENTIAL/PLATELET
BASOS ABS: 0.1 10*3/uL (ref 0.0–0.1)
Basophils Relative: 0.7 % (ref 0.0–3.0)
EOS ABS: 0.1 10*3/uL (ref 0.0–0.7)
Eosinophils Relative: 1.3 % (ref 0.0–5.0)
HCT: 39.3 % (ref 36.0–46.0)
Hemoglobin: 13.5 g/dL (ref 12.0–15.0)
Lymphocytes Relative: 22.4 % (ref 12.0–46.0)
Lymphs Abs: 1.7 10*3/uL (ref 0.7–4.0)
MCHC: 34.3 g/dL (ref 30.0–36.0)
MCV: 98.6 fl (ref 78.0–100.0)
MONOS PCT: 11.7 % (ref 3.0–12.0)
Monocytes Absolute: 0.9 10*3/uL (ref 0.1–1.0)
NEUTROS ABS: 4.7 10*3/uL (ref 1.4–7.7)
Neutrophils Relative %: 63.9 % (ref 43.0–77.0)
Platelets: 301 10*3/uL (ref 150.0–400.0)
RBC: 3.98 Mil/uL (ref 3.87–5.11)
RDW: 12.6 % (ref 11.5–15.5)
WBC: 7.4 10*3/uL (ref 4.0–10.5)

## 2017-03-23 ENCOUNTER — Ambulatory Visit: Payer: 59 | Admitting: Podiatry

## 2017-03-29 ENCOUNTER — Telehealth: Payer: Self-pay | Admitting: Internal Medicine

## 2017-03-29 ENCOUNTER — Ambulatory Visit (INDEPENDENT_AMBULATORY_CARE_PROVIDER_SITE_OTHER)
Admission: RE | Admit: 2017-03-29 | Discharge: 2017-03-29 | Disposition: A | Discharge status: Home / Self Care | Payer: 59 | Source: Ambulatory Visit | Attending: Internal Medicine | Admitting: Internal Medicine

## 2017-03-29 ENCOUNTER — Ambulatory Visit (INDEPENDENT_AMBULATORY_CARE_PROVIDER_SITE_OTHER): Payer: 59 | Admitting: Internal Medicine

## 2017-03-29 ENCOUNTER — Encounter: Payer: Self-pay | Admitting: Internal Medicine

## 2017-03-29 VITALS — BP 128/84 | HR 104 | Temp 98.4°F | Ht 66.75 in | Wt 139.0 lb

## 2017-03-29 DIAGNOSIS — I10 Essential (primary) hypertension: Secondary | ICD-10-CM

## 2017-03-29 DIAGNOSIS — M7989 Other specified soft tissue disorders: Secondary | ICD-10-CM

## 2017-03-29 DIAGNOSIS — D0511 Intraductal carcinoma in situ of right breast: Secondary | ICD-10-CM | POA: Diagnosis not present

## 2017-03-29 DIAGNOSIS — M79674 Pain in right toe(s): Secondary | ICD-10-CM

## 2017-03-29 DIAGNOSIS — Z853 Personal history of malignant neoplasm of breast: Secondary | ICD-10-CM

## 2017-03-29 DIAGNOSIS — S92322D Displaced fracture of second metatarsal bone, left foot, subsequent encounter for fracture with routine healing: Secondary | ICD-10-CM | POA: Diagnosis not present

## 2017-03-29 DIAGNOSIS — Z0000 Encounter for general adult medical examination without abnormal findings: Secondary | ICD-10-CM

## 2017-03-29 MED ORDER — VENLAFAXINE HCL ER 75 MG PO CP24: 75.0000 mg | ORAL_CAPSULE | Freq: Every day | ORAL | 11 refills | Status: DC

## 2017-03-29 MED ORDER — VENLAFAXINE HCL ER 75 MG PO CP24: 75.0000 mg | ORAL_CAPSULE | Freq: Every day | ORAL | 3 refills | Status: DC

## 2017-03-29 NOTE — Patient Instructions (Signed)
Health Maintenance for Postmenopausal Women Menopause is a normal process in which your reproductive ability comes to an end. This process happens gradually over a span of months to years, usually between the ages of 22 and 9. Menopause is complete when you have missed 12 consecutive menstrual periods. It is important to talk with your health care provider about some of the most common conditions that affect postmenopausal women, such as heart disease, cancer, and bone loss (osteoporosis). Adopting a healthy lifestyle and getting preventive care can help to promote your health and wellness. Those actions can also lower your chances of developing some of these common conditions. What should I know about menopause? During menopause, you may experience a number of symptoms, such as:  Moderate-to-severe hot flashes.  Night sweats.  Decrease in sex drive.  Mood swings.  Headaches.  Tiredness.  Irritability.  Memory problems.  Insomnia.  Choosing to treat or not to treat menopausal changes is an individual decision that you make with your health care provider. What should I know about hormone replacement therapy and supplements? Hormone therapy products are effective for treating symptoms that are associated with menopause, such as hot flashes and night sweats. Hormone replacement carries certain risks, especially as you become older. If you are thinking about using estrogen or estrogen with progestin treatments, discuss the benefits and risks with your health care provider. What should I know about heart disease and stroke? Heart disease, heart attack, and stroke become more likely as you age. This may be due, in part, to the hormonal changes that your body experiences during menopause. These can affect how your body processes dietary fats, triglycerides, and cholesterol. Heart attack and stroke are both medical emergencies. There are many things that you can do to help prevent heart disease  and stroke:  Have your blood pressure checked at least every 1-2 years. High blood pressure causes heart disease and increases the risk of stroke.  If you are 53-22 years old, ask your health care provider if you should take aspirin to prevent a heart attack or a stroke.  Do not use any tobacco products, including cigarettes, chewing tobacco, or electronic cigarettes. If you need help quitting, ask your health care provider.  It is important to eat a healthy diet and maintain a healthy weight. ? Be sure to include plenty of vegetables, fruits, low-fat dairy products, and lean protein. ? Avoid eating foods that are high in solid fats, added sugars, or salt (sodium).  Get regular exercise. This is one of the most important things that you can do for your health. ? Try to exercise for at least 150 minutes each week. The type of exercise that you do should increase your heart rate and make you sweat. This is known as moderate-intensity exercise. ? Try to do strengthening exercises at least twice each week. Do these in addition to the moderate-intensity exercise.  Know your numbers.Ask your health care provider to check your cholesterol and your blood glucose. Continue to have your blood tested as directed by your health care provider.  What should I know about cancer screening? There are several types of cancer. Take the following steps to reduce your risk and to catch any cancer development as early as possible. Breast Cancer  Practice breast self-awareness. ? This means understanding how your breasts normally appear and feel. ? It also means doing regular breast self-exams. Let your health care provider know about any changes, no matter how small.  If you are 40  or older, have a clinician do a breast exam (clinical breast exam or CBE) every year. Depending on your age, family history, and medical history, it may be recommended that you also have a yearly breast X-ray (mammogram).  If you  have a family history of breast cancer, talk with your health care provider about genetic screening.  If you are at high risk for breast cancer, talk with your health care provider about having an MRI and a mammogram every year.  Breast cancer (BRCA) gene test is recommended for women who have family members with BRCA-related cancers. Results of the assessment will determine the need for genetic counseling and BRCA1 and for BRCA2 testing. BRCA-related cancers include these types: ? Breast. This occurs in males or females. ? Ovarian. ? Tubal. This may also be called fallopian tube cancer. ? Cancer of the abdominal or pelvic lining (peritoneal cancer). ? Prostate. ? Pancreatic.  Cervical, Uterine, and Ovarian Cancer Your health care provider may recommend that you be screened regularly for cancer of the pelvic organs. These include your ovaries, uterus, and vagina. This screening involves a pelvic exam, which includes checking for microscopic changes to the surface of your cervix (Pap test).  For women ages 21-65, health care providers may recommend a pelvic exam and a Pap test every three years. For women ages 79-65, they may recommend the Pap test and pelvic exam, combined with testing for human papilloma virus (HPV), every five years. Some types of HPV increase your risk of cervical cancer. Testing for HPV may also be done on women of any age who have unclear Pap test results.  Other health care providers may not recommend any screening for nonpregnant women who are considered low risk for pelvic cancer and have no symptoms. Ask your health care provider if a screening pelvic exam is right for you.  If you have had past treatment for cervical cancer or a condition that could lead to cancer, you need Pap tests and screening for cancer for at least 20 years after your treatment. If Pap tests have been discontinued for you, your risk factors (such as having a new sexual partner) need to be  reassessed to determine if you should start having screenings again. Some women have medical problems that increase the chance of getting cervical cancer. In these cases, your health care provider may recommend that you have screening and Pap tests more often.  If you have a family history of uterine cancer or ovarian cancer, talk with your health care provider about genetic screening.  If you have vaginal bleeding after reaching menopause, tell your health care provider.  There are currently no reliable tests available to screen for ovarian cancer.  Lung Cancer Lung cancer screening is recommended for adults 69-62 years old who are at high risk for lung cancer because of a history of smoking. A yearly low-dose CT scan of the lungs is recommended if you:  Currently smoke.  Have a history of at least 30 pack-years of smoking and you currently smoke or have quit within the past 15 years. A pack-year is smoking an average of one pack of cigarettes per day for one year.  Yearly screening should:  Continue until it has been 15 years since you quit.  Stop if you develop a health problem that would prevent you from having lung cancer treatment.  Colorectal Cancer  This type of cancer can be detected and can often be prevented.  Routine colorectal cancer screening usually begins at  age 42 and continues through age 45.  If you have risk factors for colon cancer, your health care provider may recommend that you be screened at an earlier age.  If you have a family history of colorectal cancer, talk with your health care provider about genetic screening.  Your health care provider may also recommend using home test kits to check for hidden blood in your stool.  A small camera at the end of a tube can be used to examine your colon directly (sigmoidoscopy or colonoscopy). This is done to check for the earliest forms of colorectal cancer.  Direct examination of the colon should be repeated every  5-10 years until age 71. However, if early forms of precancerous polyps or small growths are found or if you have a family history or genetic risk for colorectal cancer, you may need to be screened more often.  Skin Cancer  Check your skin from head to toe regularly.  Monitor any moles. Be sure to tell your health care provider: ? About any new moles or changes in moles, especially if there is a change in a mole's shape or color. ? If you have a mole that is larger than the size of a pencil eraser.  If any of your family members has a history of skin cancer, especially at a young age, talk with your health care provider about genetic screening.  Always use sunscreen. Apply sunscreen liberally and repeatedly throughout the day.  Whenever you are outside, protect yourself by wearing [place] sleeves, pants, a wide-brimmed hat, and sunglasses.  What should I know about osteoporosis? Osteoporosis is a condition in which bone destruction happens more quickly than new bone creation. After menopause, you may be at an increased risk for osteoporosis. To help prevent osteoporosis or the bone fractures that can happen because of osteoporosis, the following is recommended:  If you are 46-71 years old, get at least 1,000 mg of calcium and at least 600 mg of vitamin D per day.  If you are older than age 55 but younger than age 65, get at least 1,200 mg of calcium and at least 600 mg of vitamin D per day.  If you are older than age 54, get at least 1,200 mg of calcium and at least 800 mg of vitamin D per day.  Smoking and excessive alcohol intake increase the risk of osteoporosis. Eat foods that are rich in calcium and vitamin D, and do weight-bearing exercises several times each week as directed by your health care provider. What should I know about how menopause affects my mental health? Depression may occur at any age, but it is more common as you become older. Common symptoms of depression  include:  Low or sad mood.  Changes in sleep patterns.  Changes in appetite or eating patterns.  Feeling an overall lack of motivation or enjoyment of activities that you previously enjoyed.  Frequent crying spells.  Talk with your health care provider if you think that you are experiencing depression. What should I know about immunizations? It is important that you get and maintain your immunizations. These include:  Tetanus, diphtheria, and pertussis (Tdap) booster vaccine.  Influenza every year before the flu season begins.  Pneumonia vaccine.  Shingles vaccine.  Your health care provider may also recommend other immunizations. This information is not intended to replace advice given to you by your health care provider. Make sure you discuss any questions you have with your health care provider. Document Released: 10/08/2005  Document Revised: 03/05/2016 Document Reviewed: 05/20/2015 Elsevier Interactive Patient Education  2018 [employer]  

## 2017-03-29 NOTE — Assessment & Plan Note (Signed)
In remission On Tamoxifen

## 2017-03-29 NOTE — Telephone Encounter (Signed)
Pt returned your call about xray

## 2017-03-29 NOTE — Progress Notes (Signed)
Subjective:    Patient ID: Jane RyderStacia R Moorehouse, female    DOB: Apr 24, 1961, 56 y.o.   MRN: [id number]  HPI  Pt presents to the clinic today for her annual exam. She is also due to follow up chronic conditions.  HTN: Her BP today is 128/84. She is taking Lisinopril as prescribed. There is no ECG on file.  History of Breast Cancer: s/p excision and radiation. She is now on Tamoxifen. She is also taking Effexor for menopausal symptoms. She does feel like her Effexor needs to be increased due to increase of stress in her life. She follows with Dr. Evette CristalSankar.  Flu: 05/2015 Tetanus: > 10 years Zostovax: 02/2015 Pap Smear: 07/2015, [place] Mammogram: 09/2016 Colon Screening: never Vision Screening: as needed Dentist: annually  Diet: She does eat meat. She consumes fruits and veggies daily. She tries to avoid fried foods. She drinks mostly water. Exercise: None  Review of Systems      Past Medical History:  Diagnosis Date  . 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 Chicken pox   . Kidney stones 2013  . Mammographic microcalcification 2013  . Personal history of radiation therapy 2014   BREAST CA  . Pregnancy examination or test, pregnancy unconfirmed 2014  . Shingles June 2015    Current Outpatient Prescriptions  Medication Sig Dispense Refill  . Ascorbic Acid (VITAMIN C) 1000 MG tablet Take 1,000 mg by mouth daily.    . colchicine 0.6 MG tablet Take 1 tablet (0.6 mg total) by mouth daily as needed (gout flare). 30 tablet 0  . lisinopril (PRINIVIL,ZESTRIL) 20 MG tablet Take 1 tablet (20 mg total) by mouth daily. 90 tablet 3  . predniSONE (DELTASONE) 20 MG tablet Take two tablets daily for 5 days followed by one tablet daily for 5 days 15 tablet 0  . tamoxifen (NOLVADEX) 20 MG tablet TAKE 1 TABLET BY MOUTH  DAILY 90 tablet 4  . venlafaxine XR (EFFEXOR-XR) 37.5 MG 24 hr capsule TAKE 1 CAPSULE BY MOUTH  DAILY WITH BREAKFAST  90 capsule 0   No current facility-administered medications for this visit.     No Known Allergies  Family History  Problem Relation Age of Onset  . Hyperlipidemia Father   . Heart disease Father   . Hypertension Father   . Stroke Paternal Grandmother   . Cancer Neg Hx   . Breast cancer Neg Hx     Social History   Social History  . Marital status: Married    Spouse name: N/A  . Number of children: N/A  . Years of education: N/A   Occupational History  . Not on file.   Social History Main Topics  . Smoking status: Current Every Day Smoker    Packs/day: 0.50    Years: 28.00    Types: Cigarettes  . Smokeless tobacco: Never Used  . Alcohol use 3.6 oz/week    6 Glasses of wine per week     Comment: beer or wine---occasional  . Drug use: No  . Sexual activity: Yes   Other Topics Concern  . Not on file   Social History Narrative  . No narrative on file     Constitutional: Denies fever, malaise, fatigue, headache or abrupt weight changes.  HEENT: Denies eye pain, eye redness, ear pain, ringing in the ears, wax buildup, runny nose, nasal congestion, bloody nose, or sore throat. Respiratory: Denies difficulty breathing, shortness of  breath, cough or sputum production.   Cardiovascular: Pt reports right foot swelling. Denies chest pain, chest tightness, palpitations or swelling in the hands.  Gastrointestinal: Denies abdominal pain, bloating, constipation, diarrhea or blood in the stool.  GU: Denies urgency, frequency, pain with urination, burning sensation, blood in urine, odor or discharge. Musculoskeletal: Denies decrease in range of motion, difficulty with gait, muscle pain or joint pain and swelling.  Skin: Denies redness, rashes, lesions or ulcercations.  Neurological: Denies dizziness, difficulty with memory, difficulty with speech or problems with balance and coordination.  Psych: Denies anxiety, depression, SI/HI.  No other specific complaints in a complete  review of systems (except as listed in HPI above).  Objective:   Physical Exam   BP 128/84   Pulse (!) 104   Temp 98.4 F (36.9 C) (Oral)   Ht 5' 6.75" (1.695 m)   Wt 139 lb (63 kg)   LMP 05/20/2015   SpO2 98%   BMI 21.93 kg/m  Wt Readings from Last 3 Encounters:  03/29/17 139 lb (63 kg)  03/14/17 141 lb 12 oz (64.3 kg)  02/10/17 143 lb 12 oz (65.2 kg)    General: Appears her stated age, well developed, well nourished in NAD. Skin: Warm, dry and intact.  HEENT: Head: normal shape and size; Eyes: sclera white, no icterus, conjunctiva pink, PERRLA and EOMs intact; Ears: Tm's gray and intact, normal light reflex; Throat/Mouth: Teeth present, mucosa pink and moist, no exudate, lesions or ulcerations noted.  Neck:  Neck supple, trachea midline. No masses, lumps or thyromegaly present.  Cardiovascular: Normal rate and rhythm. S1,S2 noted.  No murmur, rubs or gallops noted.. No carotid bruits noted. Pulmonary/Chest: Normal effort and positive vesicular breath sounds. No respiratory distress. No wheezes, rales or ronchi noted.  Abdomen: Soft and nontender. Normal bowel sounds. No distention or masses noted. Liver, spleen and kidneys non palpable. Musculoskeletal: Strength 5/5 BUE/BLE. Swelling noted just below the tarsals, right foot. No difficulty with gait.  Neurological: Alert and oriented. Cranial nerves II-XII grossly intact. Coordination normal.  Psychiatric: Mood and affect normal. Behavior is normal. Judgment and thought content normal.    BMET    Component Value Date/Time   NA 138 03/14/2017 1610   NA 137 03/18/2016 1425   K 4.5 03/14/2017 1610   CL 104 03/14/2017 1610   CO2 26 03/14/2017 1610   GLUCOSE 104 (H) 03/14/2017 1610   BUN 14 03/14/2017 1610   BUN 7 03/18/2016 1425   CREATININE 0.72 03/14/2017 1610   CALCIUM 10.1 03/14/2017 1610   GFRNONAA 86 03/18/2016 1425   GFRAA 99 03/18/2016 1425    Lipid Panel     Component Value Date/Time   CHOL 141 03/18/2016  1425   TRIG 65 03/18/2016 1425   HDL 59 03/18/2016 1425   CHOLHDL 2.4 03/18/2016 1425   LDLCALC 69 03/18/2016 1425    CBC    Component Value Date/Time   WBC 7.4 03/14/2017 1610   RBC 3.98 03/14/2017 1610   HGB 13.5 03/14/2017 1610   HGB 14.2 03/18/2016 1425   HCT 39.3 03/14/2017 1610   HCT 42.0 03/18/2016 1425   PLT 301.0 03/14/2017 1610   PLT 302 03/18/2016 1425   MCV 98.6 03/14/2017 1610   MCV 99 (H) 03/18/2016 1425   MCV 100 12/07/2012 0814   MCH 33.4 (H) 03/18/2016 1425   MCH 33.4 12/07/2012 0814   MCHC 34.3 03/14/2017 1610   RDW 12.6 03/14/2017 1610   RDW 12.3 03/18/2016 1425  RDW 12.5 12/07/2012 0814   LYMPHSABS 1.7 03/14/2017 1610   LYMPHSABS 1.2 02/20/2015 1031   LYMPHSABS 0.7 (L) 12/07/2012 0814   MONOABS 0.9 03/14/2017 1610   MONOABS 0.7 12/07/2012 0814   EOSABS 0.1 03/14/2017 1610   EOSABS 0.0 02/20/2015 1031   EOSABS 0.0 12/07/2012 0814   BASOSABS 0.1 03/14/2017 1610   BASOSABS 0.0 02/20/2015 1031   BASOSABS 0.0 12/07/2012 0814    Hgb A1C Lab Results  Component Value Date   HGBA1C 5.4 03/31/2016        Assessment & Plan:   Preventative Health Maintenance:  Encouraged her to get a flu shot in the fall She declines tetanus injection today Pap smear and mammogram UTD She declines colonoscopy or Cologuard Encouraged her to consume a balanced diet and exercise regimen Advised her to see an eye doctor and dentist annually Will check CBC, CMET, Lipid, A1C and Vit D today  Right Foot Swelling and Pain:  ? Side effect of Tamoxifen Xray right foot today  RTC in 1 year, sooner if needed Nicki ReaperBAITY, REGINA, NP

## 2017-03-29 NOTE — Assessment & Plan Note (Signed)
Controlled on Lisinopril Will monitor 

## 2017-03-30 ENCOUNTER — Other Ambulatory Visit: Payer: Self-pay | Admitting: Internal Medicine

## 2017-03-30 DIAGNOSIS — S92324G Nondisplaced fracture of second metatarsal bone, right foot, subsequent encounter for fracture with delayed healing: Secondary | ICD-10-CM

## 2017-03-30 DIAGNOSIS — S92324A Nondisplaced fracture of second metatarsal bone, right foot, initial encounter for closed fracture: Secondary | ICD-10-CM | POA: Diagnosis not present

## 2017-03-30 LAB — COMPREHENSIVE METABOLIC PANEL
ALBUMIN: 4.4 g/dL (ref 3.5–5.5)
ALT: 15 IU/L (ref 0–32)
AST: 16 IU/L (ref 0–40)
Albumin/Globulin Ratio: 1.8 (ref 1.2–2.2)
Alkaline Phosphatase: 84 IU/L (ref 39–117)
BUN/Creatinine Ratio: 13 (ref 9–23)
BUN: 10 mg/dL (ref 6–24)
Bilirubin Total: 0.8 mg/dL (ref 0.0–1.2)
CALCIUM: 9.8 mg/dL (ref 8.7–10.2)
CO2: 23 mmol/L (ref 20–29)
CREATININE: 0.78 mg/dL (ref 0.57–1.00)
Chloride: 100 mmol/L (ref 96–106)
GFR, EST AFRICAN AMERICAN: 100 mL/min/{1.73_m2} (ref >59)
GFR, EST NON AFRICAN AMERICAN: 86 mL/min/{1.73_m2} (ref >59)
GLOBULIN, TOTAL: 2.5 g/dL (ref 1.5–4.5)
Glucose: 108 mg/dL — ABNORMAL HIGH (ref 65–99)
POTASSIUM: 4.5 mmol/L (ref 3.5–5.2)
SODIUM: 141 mmol/L (ref 134–144)
TOTAL PROTEIN: 6.9 g/dL (ref 6.0–8.5)

## 2017-03-30 LAB — LIPID PANEL
CHOL/HDL RATIO: 2.7 ratio (ref 0.0–4.4)
Cholesterol, Total: 144 mg/dL (ref 100–199)
HDL: 53 mg/dL (ref >39)
LDL CALC: 68 mg/dL (ref 0–99)
Triglycerides: 115 mg/dL (ref 0–149)
VLDL Cholesterol Cal: 23 mg/dL (ref 5–40)

## 2017-03-30 LAB — CBC
HEMATOCRIT: 39.7 % (ref 34.0–46.6)
HEMOGLOBIN: 14.1 g/dL (ref 11.1–15.9)
MCH: 33.3 pg — ABNORMAL HIGH (ref 26.6–33.0)
MCHC: 35.5 g/dL (ref 31.5–35.7)
MCV: 94 fL (ref 79–97)
Platelets: 287 10*3/uL (ref 150–379)
RBC: 4.23 x10E6/uL (ref 3.77–5.28)
RDW: 12.6 % (ref 12.3–15.4)
WBC: 8.2 10*3/uL (ref 3.4–10.8)

## 2017-03-30 LAB — HEMOGLOBIN A1C
Est. average glucose Bld gHb Est-mCnc: 108 mg/dL
HEMOGLOBIN A1C: 5.4 % (ref 4.8–5.6)

## 2017-03-30 LAB — VITAMIN D 25 HYDROXY (VIT D DEFICIENCY, FRACTURES): VIT D 25 HYDROXY: 28.4 ng/mL — AB (ref 30.0–100.0)

## 2017-04-01 ENCOUNTER — Telehealth: Payer: Self-pay | Admitting: Internal Medicine

## 2017-04-01 NOTE — Telephone Encounter (Signed)
Patient called to find out if her paperwork for her wellness exam has been faxed along with her lab results to [employer] screenings.  Please call patient back on Monday to let her know if this has been done.

## 2017-04-05 ENCOUNTER — Other Ambulatory Visit: Payer: Self-pay | Admitting: Orthopedic Surgery

## 2017-04-05 DIAGNOSIS — M84374A Stress fracture, right foot, initial encounter for fracture: Secondary | ICD-10-CM

## 2017-04-05 DIAGNOSIS — S92901A Unspecified fracture of right foot, initial encounter for closed fracture: Secondary | ICD-10-CM

## 2017-04-05 NOTE — Telephone Encounter (Signed)
I spoke to pt and she will pick up completed form along with a copy of lab results... I have placed in front office for pick up per pt request

## 2017-04-07 ENCOUNTER — Encounter: Payer: 59 | Admitting: Internal Medicine

## 2017-04-08 ENCOUNTER — Other Ambulatory Visit: Payer: Self-pay

## 2017-04-08 DIAGNOSIS — I10 Essential (primary) hypertension: Secondary | ICD-10-CM

## 2017-04-08 MED ORDER — LISINOPRIL 20 MG PO TABS: 20.0000 mg | ORAL_TABLET | Freq: Every day | ORAL | 3 refills | Status: DC

## 2017-04-10 ENCOUNTER — Other Ambulatory Visit: Payer: Self-pay | Admitting: Family Medicine

## 2017-04-12 ENCOUNTER — Telehealth: Payer: Self-pay | Admitting: Internal Medicine

## 2017-04-12 NOTE — Telephone Encounter (Signed)
Caller Name:Jane Gonzales  Relationship to Patient:self  Best number:[phone] Pharmacy:  Reason for call: pt dropped off health screening appeal form. Please call when ready to be picked up.   Placing in  rx tower

## 2017-04-13 DIAGNOSIS — S92324D Nondisplaced fracture of second metatarsal bone, right foot, subsequent encounter for fracture with routine healing: Secondary | ICD-10-CM | POA: Diagnosis not present

## 2017-04-14 NOTE — Telephone Encounter (Signed)
Pt has appt on 04/18/2017 for form completion as the things listed on form was not discussed at [facility]

## 2017-04-18 ENCOUNTER — Ambulatory Visit (INDEPENDENT_AMBULATORY_CARE_PROVIDER_SITE_OTHER): Payer: 59 | Admitting: Internal Medicine

## 2017-04-18 ENCOUNTER — Encounter: Payer: Self-pay | Admitting: Internal Medicine

## 2017-04-18 VITALS — BP 124/86 | HR 85 | Temp 98.5°F | Wt 139.0 lb

## 2017-04-18 DIAGNOSIS — F17200 Nicotine dependence, unspecified, uncomplicated: Secondary | ICD-10-CM | POA: Diagnosis not present

## 2017-04-18 DIAGNOSIS — Z0289 Encounter for other administrative examinations: Secondary | ICD-10-CM

## 2017-04-18 NOTE — Progress Notes (Signed)
Subjective:    Patient ID: Jane Gonzales, female    DOB: 03-29-1961, 56 y.o.   MRN: [id number]  HPI  Pt presents to the clinic today for form completion. She has a Electronics engineer form for insurance stating that she is unable to achieve the tobacco free wellness goal. She is currently smoking 1/2 ppd. She smokes in the morning and evenings mostly. She smokes more on the weekends than during the week. She has never contemplated quitting smoking. She reports smoking is her stress relief.  Review of Systems      Past Medical History:  Diagnosis Date  . 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 Chicken pox   . Kidney stones 2013  . Mammographic microcalcification 2013  . Personal history of radiation therapy 2014   BREAST CA  . Pregnancy examination or test, pregnancy unconfirmed 2014  . Shingles June 2015    Current Outpatient Prescriptions  Medication Sig Dispense Refill  . Ascorbic Acid (VITAMIN C) 1000 MG tablet Take 1,000 mg by mouth daily.    . colchicine 0.6 MG tablet TAKE 1 TABLET (0.6 MG TOTAL) BY MOUTH DAILY AS NEEDED (GOUT FLARE). 90 tablet 0  . lisinopril (PRINIVIL,ZESTRIL) 20 MG tablet Take 1 tablet (20 mg total) by mouth daily. 90 tablet 3  . tamoxifen (NOLVADEX) 20 MG tablet TAKE 1 TABLET BY MOUTH  DAILY 90 tablet 4  . venlafaxine XR (EFFEXOR-XR) 75 MG 24 hr capsule Take 1 capsule (75 mg total) by mouth daily with breakfast. 90 capsule 3   No current facility-administered medications for this visit.     No Known Allergies  Family History  Problem Relation Age of Onset  . Hyperlipidemia Father   . Heart disease Father   . Hypertension Father   . Stroke Paternal Grandmother   . Cancer Neg Hx   . Breast cancer Neg Hx     Social History   Social History  . Marital status: Married    Spouse name: N/A  . Number of children: N/A  . Years of education: N/A   Occupational History  . Not on file.    Social History Main Topics  . Smoking status: Current Every Day Smoker    Packs/day: 0.50    Years: 28.00    Types: Cigarettes  . Smokeless tobacco: Never Used  . Alcohol use 3.6 oz/week    6 Glasses of wine per week     Comment: beer or wine---occasional  . Drug use: No  . Sexual activity: Yes   Other Topics Concern  . Not on file   Social History Narrative  . No narrative on file     Constitutional: Denies fever, malaise, fatigue, headache or abrupt weight changes.  HEENT: Denies eye pain, eye redness, ear pain, ringing in the ears, wax buildup, runny nose, nasal congestion, bloody nose, or sore throat. Respiratory: Denies difficulty breathing, shortness of breath, cough or sputum production.    No other specific complaints in a complete review of systems (except as listed in HPI above).  Objective:   Physical Exam   BP 124/86   Pulse 85   Temp 98.5 F (36.9 C) (Oral)   Wt 139 lb (63 kg)   LMP 05/20/2015   SpO2 98%   BMI 21.93 kg/m  Wt Readings from Last 3 Encounters:  04/18/17 139 lb (63 kg)  03/29/17 139 lb (63 kg)  03/14/17 141 lb  12 oz (64.3 kg)    General: Appears her stated age, well developed, well nourished in NAD. Pulmonary/Chest: Normal effort and positive vesicular breath sounds. No respiratory distress. No wheezes, rales or ronchi noted.   BMET    Component Value Date/Time   NA 141 03/29/2017 0947   K 4.5 03/29/2017 0947   CL 100 03/29/2017 0947   CO2 23 03/29/2017 0947   GLUCOSE 108 (H) 03/29/2017 0947   GLUCOSE 104 (H) 03/14/2017 1610   BUN 10 03/29/2017 0947   CREATININE 0.78 03/29/2017 0947   CALCIUM 9.8 03/29/2017 0947   GFRNONAA 86 03/29/2017 0947   GFRAA 100 03/29/2017 0947    Lipid Panel     Component Value Date/Time   CHOL 144 03/29/2017 0947   TRIG 115 03/29/2017 0947   HDL 53 03/29/2017 0947   CHOLHDL 2.7 03/29/2017 0947   LDLCALC 68 03/29/2017 0947    CBC    Component Value Date/Time   WBC 8.2 03/29/2017 0947    WBC 7.4 03/14/2017 1610   RBC 4.23 03/29/2017 0947   RBC 3.98 03/14/2017 1610   HGB 14.1 03/29/2017 0947   HCT 39.7 03/29/2017 0947   PLT 287 03/29/2017 0947   MCV 94 03/29/2017 0947   MCV 100 12/07/2012 0814   MCH 33.3 (H) 03/29/2017 0947   MCH 33.4 12/07/2012 0814   MCHC 35.5 03/29/2017 0947   MCHC 34.3 03/14/2017 1610   RDW 12.6 03/29/2017 0947   RDW 12.5 12/07/2012 0814   LYMPHSABS 1.7 03/14/2017 1610   LYMPHSABS 1.2 02/20/2015 1031   LYMPHSABS 0.7 (L) 12/07/2012 0814   MONOABS 0.9 03/14/2017 1610   MONOABS 0.7 12/07/2012 0814   EOSABS 0.1 03/14/2017 1610   EOSABS 0.0 02/20/2015 1031   EOSABS 0.0 12/07/2012 0814   BASOSABS 0.1 03/14/2017 1610   BASOSABS 0.0 02/20/2015 1031   BASOSABS 0.0 12/07/2012 0814    Hgb A1C Lab Results  Component Value Date   HGBA1C 5.4 03/29/2017           Assessment & Plan:  Encounter for Form Completion, Smoker:  Form filled out Discussed cutting back on 1 cigarette every 2 weeks She is not interested in patches, gums etc  Will monitor Nicki Reaper, NP

## 2017-04-18 NOTE — Patient Instructions (Signed)
Steps to Quit Smoking Smoking tobacco can be bad for your health. It can also affect almost every organ in your body. Smoking puts you and people around you at risk for many serious Carlson-lasting (chronic) diseases. Quitting smoking is hard, but it is one of the best things that you can do for your health. It is never too late to quit. What are the benefits of quitting smoking? When you quit smoking, you lower your risk for getting serious diseases and conditions. They can include:  Lung cancer or lung disease.  Heart disease.  Stroke.  Heart attack.  Not being able to have children (infertility).  Weak bones (osteoporosis) and broken bones (fractures).  If you have coughing, wheezing, and shortness of breath, those symptoms may get better when you quit. You may also get sick less often. If you are pregnant, quitting smoking can help to lower your chances of having a baby of low birth weight. What can I do to help me quit smoking? Talk with your doctor about what can help you quit smoking. Some things you can do (strategies) include:  Quitting smoking totally, instead of slowly cutting back how much you smoke over a period of time.  Going to in-person counseling. You are more likely to quit if you go to many counseling sessions.  Using resources and support systems, such as: ? Online chats with a counselor. ? Phone quitlines. ? Printed self-help materials. ? Support groups or group counseling. ? Text messaging programs. ? Mobile phone apps or applications.  Taking medicines. Some of these medicines may have nicotine in them. If you are pregnant or breastfeeding, do not take any medicines to quit smoking unless your doctor says it is okay. Talk with your doctor about counseling or other things that can help you.  Talk with your doctor about using more than one strategy at the same time, such as taking medicines while you are also going to in-person counseling. This can help make  quitting easier. What things can I do to make it easier to quit? Quitting smoking might feel very hard at first, but there is a lot that you can do to make it easier. Take these steps:  Talk to your family and friends. Ask them to support and encourage you.  Call phone quitlines, reach out to support groups, or work with a counselor.  Ask people who smoke to not smoke around you.  Avoid places that make you want (trigger) to smoke, such as: ? Bars. ? Parties. ? Smoke-break areas at work.  Spend time with people who do not smoke.  Lower the stress in your life. Stress can make you want to smoke. Try these things to help your stress: ? Getting regular exercise. ? Deep-breathing exercises. ? Yoga. ? Meditating. ? Doing a body scan. To do this, close your eyes, focus on one area of your body at a time from head to toe, and notice which parts of your body are tense. Try to relax the muscles in those areas.  Download or buy apps on your mobile phone or tablet that can help you stick to your quit plan. There are many free apps, such as QuitGuide from the CDC (Centers for Disease Control and Prevention). You can find more support from smokefree.gov and other websites.  This information is not intended to replace advice given to you by your health care provider. Make sure you discuss any questions you have with your health care provider. Document Released: 06/12/2009 Document   Revised: 04/13/2016 Document Reviewed: 12/31/2014 Elsevier Interactive Patient Education  2018 Elsevier Inc.  

## 2017-05-04 DIAGNOSIS — S92324D Nondisplaced fracture of second metatarsal bone, right foot, subsequent encounter for fracture with routine healing: Secondary | ICD-10-CM | POA: Diagnosis not present

## 2017-05-24 ENCOUNTER — Ambulatory Visit
Admission: RE | Admit: 2017-05-24 | Discharge: 2017-05-24 | Disposition: A | Discharge status: Home / Self Care | Payer: 59 | Source: Ambulatory Visit | Attending: Orthopedic Surgery | Admitting: Orthopedic Surgery

## 2017-05-24 DIAGNOSIS — M8588 Other specified disorders of bone density and structure, other site: Secondary | ICD-10-CM | POA: Insufficient documentation

## 2017-05-24 DIAGNOSIS — M84376A Stress fracture, unspecified foot, initial encounter for fracture: Secondary | ICD-10-CM | POA: Diagnosis not present

## 2017-05-24 DIAGNOSIS — S92901A Unspecified fracture of right foot, initial encounter for closed fracture: Secondary | ICD-10-CM | POA: Diagnosis present

## 2017-05-24 DIAGNOSIS — M8589 Other specified disorders of bone density and structure, multiple sites: Secondary | ICD-10-CM | POA: Diagnosis not present

## 2017-05-24 DIAGNOSIS — M84374A Stress fracture, right foot, initial encounter for fracture: Secondary | ICD-10-CM

## 2017-06-01 DIAGNOSIS — S92324D Nondisplaced fracture of second metatarsal bone, right foot, subsequent encounter for fracture with routine healing: Secondary | ICD-10-CM | POA: Diagnosis not present

## 2017-06-06 DIAGNOSIS — Z23 Encounter for immunization: Secondary | ICD-10-CM | POA: Diagnosis not present

## 2017-06-16 DIAGNOSIS — M8580 Other specified disorders of bone density and structure, unspecified site: Secondary | ICD-10-CM | POA: Diagnosis not present

## 2017-06-16 DIAGNOSIS — E559 Vitamin D deficiency, unspecified: Secondary | ICD-10-CM | POA: Diagnosis not present

## 2017-09-23 DIAGNOSIS — R51 Headache: Secondary | ICD-10-CM | POA: Diagnosis not present

## 2017-09-27 DIAGNOSIS — E559 Vitamin D deficiency, unspecified: Secondary | ICD-10-CM | POA: Diagnosis not present

## 2017-09-28 ENCOUNTER — Other Ambulatory Visit: Payer: Self-pay

## 2017-09-28 DIAGNOSIS — D0511 Intraductal carcinoma in situ of right breast: Secondary | ICD-10-CM

## 2017-10-03 ENCOUNTER — Encounter: Payer: Self-pay | Admitting: *Deleted

## 2017-10-23 DIAGNOSIS — J00 Acute nasopharyngitis [common cold]: Secondary | ICD-10-CM | POA: Diagnosis not present

## 2017-10-23 DIAGNOSIS — J029 Acute pharyngitis, unspecified: Secondary | ICD-10-CM | POA: Diagnosis not present

## 2017-10-25 DIAGNOSIS — H9220 Otorrhagia, unspecified ear: Secondary | ICD-10-CM | POA: Diagnosis not present

## 2017-10-26 ENCOUNTER — Encounter: Payer: Self-pay | Admitting: Family Medicine

## 2017-10-26 ENCOUNTER — Ambulatory Visit: Payer: 59 | Admitting: Family Medicine

## 2017-10-26 VITALS — BP 118/68 | HR 94 | Temp 99.0°F | Wt 146.5 lb

## 2017-10-26 DIAGNOSIS — H6692 Otitis media, unspecified, left ear: Secondary | ICD-10-CM

## 2017-10-26 DIAGNOSIS — H6691 Otitis media, unspecified, right ear: Secondary | ICD-10-CM

## 2017-10-26 DIAGNOSIS — H7292 Unspecified perforation of tympanic membrane, left ear: Secondary | ICD-10-CM

## 2017-10-26 MED ORDER — AMOXICILLIN-POT CLAVULANATE 875-125 MG PO TABS: 1.0000 | ORAL_TABLET | Freq: Two times a day (BID) | ORAL | 0 refills | Status: DC

## 2017-10-26 MED ORDER — OFLOXACIN 0.3 % OT SOLN: 10.0000 [drp] | Freq: Every day | OTIC | 0 refills | Status: DC

## 2017-10-26 NOTE — Progress Notes (Signed)
Subjective:    Patient ID: Jane RyderStacia R Gonzales, female    DOB: Sep 29, 1960, 57 y.o.   MRN: [id number]  HPI This is a 57 yo female who presents today with 4 days of sore throat, nasal congestion. [facility] and had negative strep and flu tests. Left ear painful and popped yesterday. Had bleeding from ear yesterday. Put H2O2 into ear last night. No fever. Little nasal drainage, some green. Little cough, worse with hot flashes. Has been taking Mucinex, dayquil and nyquil, flonase and saline.   Past Medical History:  Diagnosis Date  . 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 Chicken pox   . Kidney stones 2013  . Mammographic microcalcification 2013  . Personal history of radiation therapy 2014   BREAST CA  . Pregnancy examination or test, pregnancy unconfirmed 2014  . Shingles June 2015   Past Surgical History:  Procedure Laterality Date  . BREAST LUMPECTOMY WITH SENTINEL LYMPH NODE BIOPSY Right 09/13/2012  . KNEE SURGERY  2007   ACL  . LAPAROSCOPY  1988  . LITHOTRIPSY  1992   Family History  Problem Relation Age of Onset  . Hyperlipidemia Father   . Heart disease Father   . Hypertension Father   . Stroke Paternal Grandmother   . Cancer Neg Hx   . Breast cancer Neg Hx    Social History   Tobacco Use  . Smoking status: Current Every Day Smoker    Packs/day: 0.50    Years: 28.00    Pack years: 14.00    Types: Cigarettes  . Smokeless tobacco: Never Used  Substance Use Topics  . Alcohol use: Yes    Alcohol/week: 3.6 oz    Types: 6 Glasses of wine per week    Comment: beer or wine---occasional  . Drug use: No      Review of Systems Per HPI    Objective:   Physical Exam  Constitutional: She is oriented to person, place, and time. She appears well-developed and well-nourished. No distress.  HENT:  Head: Normocephalic and atraumatic.  Right Ear: External ear and ear canal normal. No drainage. No  mastoid tenderness. Tympanic membrane is erythematous and bulging.  Left Ear: External ear and ear canal normal. No drainage. No mastoid tenderness. Tympanic membrane is perforated and erythematous.  Ears:  Nose: Nose normal.  Mouth/Throat: Oropharynx is clear and moist.  Eyes: Conjunctivae are normal.  Neck: Normal range of motion. Neck supple.  Cardiovascular: Normal rate and normal heart sounds.  Pulmonary/Chest: Effort normal and breath sounds normal.  Neurological: She is alert and oriented to person, place, and time.  Skin: Skin is warm and dry. She is not diaphoretic.  Psychiatric: She has a normal mood and affect. Her behavior is normal. Judgment and thought content normal.  Vitals reviewed.     BP 118/68   Pulse 94   Temp 99 F (37.2 C) (Oral)   Wt 146 lb 8 oz (66.5 kg)   LMP 05/20/2015   SpO2 93%   BMI 23.12 kg/m  Wt Readings from Last 3 Encounters:  10/26/17 146 lb 8 oz (66.5 kg)  04/18/17 139 lb (63 kg)  03/29/17 139 lb (63 kg)       Assessment & Plan:  1. Acute otitis media of left ear with perforation - Provided written and verbal information regarding diagnosis and treatment. - RTC/ER precautions reviewed - if no improvement with oral  and topical antibiotics will send to ENT or if pain/hearing not restored to baseline. She was instructed to avoid putting anything in ear other than prescribed drops.  - amoxicillin-clavulanate (AUGMENTIN) 875-125 MG tablet; Take 1 tablet by mouth 2 (two) times daily.  Dispense: 20 tablet; Refill: 0 - ofloxacin (FLOXIN) 0.3 % OTIC solution; Place 10 drops into the left ear daily. X 7 days  Dispense: 5 mL; Refill: 0  2. Acute otitis media, right - amoxicillin-clavulanate (AUGMENTIN) 875-125 MG tablet; Take 1 tablet by mouth 2 (two) times daily.  Dispense: 20 tablet; Refill: 0   Olean Ree, FNP-BC  [facility], [facility]  10/26/2017 4:40 PM

## 2017-10-26 NOTE — Patient Instructions (Signed)
Otitis Media, Adult Otitis media occurs when there is inflammation and fluid in the middle ear. Your middle ear is a part of the ear that contains bones for hearing as well as air that helps send sounds to your brain. What are the causes? This condition is caused by a blockage in the eustachian tube. This tube drains fluid from the ear to the back of the nose (nasopharynx). A blockage in this tube can be caused by an object or by swelling (edema) in the tube. Problems that can cause a blockage include:  A cold or other upper respiratory infection.  Allergies.  An irritant, such as tobacco smoke.  Enlarged adenoids. The adenoids are areas of soft tissue located high in the back of the throat, behind the nose and the roof of the mouth.  A mass in the nasopharynx.  Damage to the ear caused by pressure changes (barotrauma).  What are the signs or symptoms? Symptoms of this condition include:  Ear pain.  A fever.  Decreased hearing.  A headache.  Tiredness (lethargy).  Fluid leaking from the ear.  Ringing in the ear.  How is this diagnosed? This condition is diagnosed with a physical exam. During the exam your health care provider will use an instrument called an otoscope to look into your ear and check for redness, swelling, and fluid. He or she will also ask about your symptoms. Your health care provider may also order tests, such as:  A test to check the movement of the eardrum (pneumatic otoscopy). This test is done by squeezing a small amount of air into the ear.  A test that changes air pressure in the middle ear to check how well the eardrum moves and whether the eustachian tube is working (tympanogram).  How is this treated? This condition usually goes away on its own within 3-5 days. But if the condition is caused by a bacteria infection and does not go away own its own, or keeps coming back, your health care provider may:  Prescribe antibiotic medicines to  treat the infection.  Prescribe or recommend medicines to control pain.  Follow these instructions at home:  Take over-the-counter and prescription medicines only as told by your health care provider.  If you were prescribed an antibiotic medicine, take it as told by your health care provider. Do not stop taking the antibiotic even if you start to feel better.  Keep all follow-up visits as told by your health care provider. This is important. Contact a health care provider if:  You have bleeding from your nose.  There is a lump on your neck.  You are not getting better in 5 days.  You feel worse instead of better. Get help right away if:  You have severe pain that is not controlled with medicine.  You have swelling, redness, or pain around your ear.  You have stiffness in your neck.  A part of your face is paralyzed.  The bone behind your ear (mastoid) is tender when you touch it.  You develop a severe headache. Summary  Otitis media is redness, soreness, and swelling of the middle ear.  This condition usually goes away on its own within 3-5 days.  If the problem does not go away in 3-5 days, your health care provider may prescribe or recommend medicines to treat your symptoms.  If you were prescribed an antibiotic medicine, take it as told by your health care provider. This information is not intended to replace  advice given to you by your health care provider. Make sure you discuss any questions you have with your health care provider. Document Released: 05/21/2004 Document Revised: 08/06/2016 Document Reviewed: 08/06/2016 Elsevier Interactive Patient Education  2018 [employer] Eardrum Rupture, Adult An eardrum rupture is a hole (perforation) in the eardrum. The eardrum is a thin, round tissue inside of the ear that separates the ear canal from the middle ear. The eardrum is also called the tympanic membrane. It transfers sound vibrations through small bones in the  middle ear to the hearing nerve in the inner ear. It also protects the middle ear from germs. An eardrum rupture can cause pain and hearing loss. What are the causes? This condition may be caused by:  An infection.  A sudden injury, such as from: ? Inserting a thin, sharp object into the ear. ? A hit to the side of the head, especially by an open hand. ? Falling onto water or a flat surface. ? A rapid change in pressure, such as from flying or scuba diving. ? A sudden increase in pressure against the eardrum, such as from an explosion or a very loud noise.  Inserting a cotton-tipped swab in the ear.  A Raap-term eustachian tube disorder. Eustachian tubes are parts of the body that connect each middle ear space to the back of the nose.  A medical procedure or surgery, such as a procedure to remove wax from the ear canal.  Removing a man-made pressure equalization tube(PE tube) that was placed through the eardrum.  Having a PE tube fall out.  What increases the risk? You are more likely to develop this condition if:  You have had PE tubes inserted in your ears.  You have an ear infection.  You play sports that: ? Involve balls or contact with other players. ? Take place in water, such as diving, scuba diving, or waterskiing.  What are the signs or symptoms? Symptoms of this condition include:  Sudden pain at the time of the injury.  Ear pain that suddenly improves.  Ringing in the ear after the injury.  Drainage from the ear. The drainage may be clear, cloudy or pus-like, or bloody.  Hearing loss.  Dizziness.  How is this diagnosed? This condition is diagnosed based on your symptoms and medical history as well as a physical exam. Your health care provider can usually see a perforation using an ear scope (otoscope). You may have tests, such as:  A hearing test (audiogram) to check for hearing loss.  A test in which a sample of ear drainage is tested for infection  (culture).  How is this treated? An eardrum typically heals on its own within a few weeks. If your eardrum does not heal, your health care provider may recommend a procedure to place a patch over your eardrum or surgery to repair your eardrum. Your health care provider may also prescribe antibiotic medicines to help prevent infection. If the ear heals completely, any hearing loss should be temporary. Follow these instructions at home:  Keep your ear dry. This is very important. Follow instructions from your health care provider about how to keep your ear dry. You may need to wear waterproof earplugs when bathing and swimming.  Take over-the-counter and prescription medicines only as told by your health care provider.  Return to sports and activities as told by your health care provider. Ask your health care provider what activities are safe for you.  Wear headgear with ear protection when  you play sports in which ear injuries are common.  If directed, apply heat to your affected ear as often as told by your health care provider. Use the heat source that your health care provider recommends, such as a moist heat pack or a heating pad. This will help to relieve pain. ? Place a towel between your skin and the heat source. ? Leave the heat on for 20-30 minutes. ? Remove the heat if your skin turns bright red. This is especially important if you are unable to feel pain, heat, or cold. You may have a greater risk of getting burned.  Keep all follow-up visits as told by your health care provider. This is important.  Talk to your health care provider before traveling by plane. Contact a health care provider if:  You have mucus or blood draining from your ear.  You have a fever.  You have ear pain.  You have hearing loss, dizziness, or ringing in your ear. Get help right away if:  You have sudden hearing loss.  You are very dizzy.  You have severe ear pain.  Your face feels weak or  becomes paralyzed. Summary  An eardrum rupture is a hole (perforation) in the eardrum that can cause pain and hearing loss. It is usually caused by a sudden injury to the ear.  The eardrum will likely heal on its own within a few weeks. In some cases, surgery may be necessary.  After the injury, follow instructions from your health care provider about how to keep your ear dry as it heals. This information is not intended to replace advice given to you by your health care provider. Make sure you discuss any questions you have with your health care provider. Document Released: 08/13/2000 Document Revised: 10/22/2016 Document Reviewed: 10/22/2016 Elsevier Interactive Patient Education  [employer].

## 2017-10-27 ENCOUNTER — Encounter: Payer: Self-pay | Admitting: *Deleted

## 2017-11-08 ENCOUNTER — Ambulatory Visit
Admission: RE | Admit: 2017-11-08 | Discharge: 2017-11-08 | Disposition: A | Discharge status: Home / Self Care | Payer: 59 | Source: Ambulatory Visit | Attending: General Surgery | Admitting: General Surgery

## 2017-11-08 DIAGNOSIS — D0511 Intraductal carcinoma in situ of right breast: Secondary | ICD-10-CM

## 2017-11-08 DIAGNOSIS — R928 Other abnormal and inconclusive findings on diagnostic imaging of breast: Secondary | ICD-10-CM | POA: Diagnosis not present

## 2017-11-17 ENCOUNTER — Ambulatory Visit: Payer: 59 | Admitting: General Surgery

## 2017-12-01 ENCOUNTER — Encounter: Payer: Self-pay | Admitting: General Surgery

## 2017-12-01 ENCOUNTER — Ambulatory Visit: Payer: 59 | Admitting: General Surgery

## 2017-12-01 VITALS — BP 138/82 | HR 96 | Resp 14 | Ht 66.75 in | Wt 147.0 lb

## 2017-12-01 DIAGNOSIS — D0511 Intraductal carcinoma in situ of right breast: Secondary | ICD-10-CM

## 2017-12-01 NOTE — Progress Notes (Signed)
Patient ID: Jane Gonzales, female   DOB: 25-Apr-1961, 57 y.o.   MRN: [id number]  Chief Complaint  Patient presents with  . Follow-up    HPI Jane Gonzales is a 57 y.o. female.  who presents for her follow up breast cancer and a breast evaluation. The most recent mammogram was done on 11-08-17. Former patient of Dr Evette Cristal. Patient does perform regular self breast checks and gets regular mammograms done.  No breast issues. Tolerating tamoxifen.She states she has had a couple toe fractures/trauma from playing tennis. She has had an episode of gout as well. Bone density was done 05-24-17 showing osteopenia. She states her last vitamin D levels were normal.  HPI  Past Medical History:  Diagnosis Date  . 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 Chicken pox   . Kidney stones 2013  . Mammographic microcalcification 2013  . Personal history of radiation therapy 2014   BREAST CA  . Pregnancy examination or test, pregnancy unconfirmed 2014  . Shingles June 2015    Past Surgical History:  Procedure Laterality Date  . BREAST EXCISIONAL BIOPSY Right 09/13/2014   breast cancer dcis  . BREAST LUMPECTOMY WITH SENTINEL LYMPH NODE BIOPSY Right 09/13/2012   18 mm Grade II DCIS; negative margin resection, neg SLN.  Marland Kitchen KNEE SURGERY  2007   ACL  . LAPAROSCOPY  1988  . LITHOTRIPSY  1992    Family History  Problem Relation Age of Onset  . Hyperlipidemia Father   . Heart disease Father   . Hypertension Father   . Stroke Paternal Grandmother   . Cancer Neg Hx   . Breast cancer Neg Hx     Social History Social History   Tobacco Use  . Smoking status: Current Every Day Smoker    Packs/day: 0.50    Years: 28.00    Pack years: 14.00    Types: Cigarettes  . Smokeless tobacco: Never Used  Substance Use Topics  . Alcohol use: Yes    Alcohol/week: 3.6 oz    Types: 6 Glasses of wine per week    Comment: beer or wine---occasional   . Drug use: No    No Known Allergies  Current Outpatient Medications  Medication Sig Dispense Refill  . Ascorbic Acid (VITAMIN C) 1000 MG tablet Take 1,000 mg by mouth every other day.     . cholecalciferol (VITAMIN D) 1000 units tablet Take 1,000 Units by mouth daily.    . colchicine 0.6 MG tablet TAKE 1 TABLET (0.6 MG TOTAL) BY MOUTH DAILY AS NEEDED (GOUT FLARE). 90 tablet 0  . lisinopril (PRINIVIL,ZESTRIL) 20 MG tablet Take 1 tablet (20 mg total) by mouth daily. 90 tablet 3  . tamoxifen (NOLVADEX) 20 MG tablet TAKE 1 TABLET BY MOUTH  DAILY 90 tablet 4  . venlafaxine XR (EFFEXOR-XR) 75 MG 24 hr capsule Take 1 capsule (75 mg total) by mouth daily with breakfast. 90 capsule 3  . sodium chloride (OCEAN) 0.65 % nasal spray Place into the nose.     No current facility-administered medications for this visit.     Review of Systems Review of Systems  Constitutional: Negative.   Respiratory: Negative.   Cardiovascular: Negative.     Blood pressure 138/82, pulse 96, resp. rate 14, height 5' 6.75" (1.695 m), weight 147 lb (66.7 kg), last menstrual period 05/20/2015.  Physical Exam Physical Exam  Constitutional: She is oriented to person, place, and  time. She appears well-developed and well-nourished.  Eyes: Conjunctivae are normal. No scleral icterus.  Neck: Neck supple.  Cardiovascular: Normal rate, regular rhythm and normal heart sounds.  Pulmonary/Chest: Effort normal and breath sounds normal. Right breast exhibits no inverted nipple, no mass, no nipple discharge, no skin change and no tenderness. Left breast exhibits no inverted nipple, no mass, no nipple discharge, no skin change and no tenderness.    Lymphadenopathy:    She has no cervical adenopathy.    She has no axillary adenopathy.       Right: No supraclavicular adenopathy present.  Neurological: She is alert and oriented to person, place, and time.  Skin: Skin is warm and dry.    Data Reviewed Bilateral diagnostic  mammograms dated November 08, 2017 were reviewed and compared to prior studies.  Postsurgical change.  BI-RADS-2.  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    Plan The patient has completed about 57 months of the planned 7352-month therapy with antiestrogen therapy.  She will likely discontinue a little early due to the hot flashes.  We discussed indications for genetic testing based on her young age at diagnosis.  She declined.  While she has no children, it would be important if she was at high risk for ovarian cancer.  Indications for screening colonoscopy reviewed.  She reported that she is likely to consider having this done this year.  Her husband sees Dr. Mechele CollinElliott.  She is welcome to see a physician of her choice however can have it completed through this office. Patient will be asked to return to the office in one year with a bilateral screening mammogram.  Call back if and when she is ready for colonoscopy.  The patient is aware to call back for any questions or concerns.   HPI, Physical Exam, Assessment and Plan have been scribed under the direction and in the presence of Earline MayotteJeffrey W. Jadie Comas, MD. Dorathy DaftMarsha Hatch, RN  I have completed the exam and reviewed the above documentation for accuracy and completeness.  I agree with the above.  [employer] has been used and any errors in dictation or transcription are unintentional.  Donnalee CurryJeffrey Latondra Gebhart, M.D., F.A.C.S.   Merrily PewJeffrey W Shandi Godfrey 12/01/2017, 8:41 PM

## 2017-12-01 NOTE — Patient Instructions (Addendum)
Patient will be asked to return to the office in one year with a bilateral screening mammogram. Call back when she is ready for colonoscopy.  The patient is aware to call back for any questions or concerns.

## 2017-12-08 DIAGNOSIS — H6981 Other specified disorders of Eustachian tube, right ear: Secondary | ICD-10-CM | POA: Diagnosis not present

## 2017-12-22 ENCOUNTER — Ambulatory Visit (INDEPENDENT_AMBULATORY_CARE_PROVIDER_SITE_OTHER): Payer: 59 | Admitting: Family Medicine

## 2017-12-22 ENCOUNTER — Ambulatory Visit: Payer: Self-pay | Admitting: *Deleted

## 2017-12-22 ENCOUNTER — Encounter: Payer: Self-pay | Admitting: Family Medicine

## 2017-12-22 VITALS — BP 118/64 | HR 98 | Temp 98.7°F | Ht 66.75 in | Wt 142.8 lb

## 2017-12-22 DIAGNOSIS — M25571 Pain in right ankle and joints of right foot: Secondary | ICD-10-CM | POA: Diagnosis not present

## 2017-12-22 DIAGNOSIS — Z8739 Personal history of other diseases of the musculoskeletal system and connective tissue: Secondary | ICD-10-CM | POA: Diagnosis not present

## 2017-12-22 DIAGNOSIS — M109 Gout, unspecified: Secondary | ICD-10-CM | POA: Insufficient documentation

## 2017-12-22 MED ORDER — HYDROCODONE-ACETAMINOPHEN 5-325 MG PO TABS: 1.0000 | ORAL_TABLET | Freq: Every evening | ORAL | 0 refills | Status: DC | PRN

## 2017-12-22 MED ORDER — INDOMETHACIN 50 MG PO CAPS: 50.0000 mg | ORAL_CAPSULE | Freq: Three times a day (TID) | ORAL | 0 refills | Status: DC

## 2017-12-22 NOTE — Assessment & Plan Note (Signed)
With R ankle symptoms  Uric acid and cbc today  Trial of indocin

## 2017-12-22 NOTE — Assessment & Plan Note (Addendum)
Redness/swelling and hx of gout (some question about this)  Would like to get xray but our xray is down today and pt declines going to the [place] office for this due to having to return for work  She does have ortho f/u on wed of next wk Hx of osteopenia and foot fx in the past  Lab today for uric acid and cbc Trial of indocin for pain (with food)  Also norco for pm more severe pain  Will update with results Recommend elevation and cool compresses   Meds ordered this encounter  Medications  . indomethacin (INDOCIN) 50 MG capsule    Sig: Take 1 capsule (50 mg total) by mouth 3 (three) times daily with meals.    Dispense:  20 capsule    Refill:  0  . HYDROcodone-acetaminophen (NORCO/VICODIN) 5-325 MG tablet    Sig: Take 1 tablet by mouth at bedtime as needed for moderate pain or severe pain.    Dispense:  5 tablet    Refill:  0

## 2017-12-22 NOTE — Progress Notes (Signed)
Subjective:    Patient ID: Jane Gonzales, female    DOB: 1961-01-18, 57 y.o.   MRN: [id number]  HPI Here for R ankle red/swollen and painful for 2 weeks   No injury known  She had been walking up some stairs but no pain with that  She usually works out at [employer]   Started taking gout medicine -no help -- colchicine (2 tabs per day for 3-4 days)  Drank cherry juice -no help   Has hx of gout (with uric acid neg)   This is only 2nd flair   Has had fracture Wants an xray - and declines going to [place] office   No other symptoms  No tick bite  No rash  No fever   Patient Active Problem List   Diagnosis Date Noted  . Ankle pain, right 12/22/2017  . H/O: gout 12/22/2017  . 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 Essential hypertension 02/18/2014   Past Medical History:  Diagnosis Date  . 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 Chicken pox   . Kidney stones 2013  . Mammographic microcalcification 2013  . Personal history of radiation therapy 2014   BREAST CA  . Pregnancy examination or test, pregnancy unconfirmed 2014  . Shingles June 2015   Past Surgical History:  Procedure Laterality Date  . BREAST EXCISIONAL BIOPSY Right 09/13/2014   breast cancer dcis  . BREAST LUMPECTOMY WITH SENTINEL LYMPH NODE BIOPSY Right 09/13/2012   18 mm Grade II DCIS; negative margin resection, neg SLN. ER/PR +/  . KNEE SURGERY  2007   ACL  . LAPAROSCOPY  1988  . LITHOTRIPSY  1992   Social History   Tobacco Use  . Smoking status: Current Every Day Smoker    Packs/day: 0.50    Years: 28.00    Pack years: 14.00    Types: Cigarettes  . Smokeless tobacco: Never Used  Substance Use Topics  . Alcohol use: Yes    Alcohol/week: 3.6 oz    Types: 6 Glasses of wine per week    Comment: beer or wine---occasional  . Drug use: No   Family History  Problem Relation Age of Onset  . Hyperlipidemia  Father   . Heart disease Father   . Hypertension Father   . Stroke Paternal Grandmother   . Cancer Neg Hx   . Breast cancer Neg Hx    No Known Allergies Current Outpatient Medications on File Prior to Visit  Medication Sig Dispense Refill  . Ascorbic Acid (VITAMIN C) 1000 MG tablet Take 1,000 mg by mouth every other day.     . cholecalciferol (VITAMIN D) 1000 units tablet Take 1,000 Units by mouth daily.    . colchicine 0.6 MG tablet TAKE 1 TABLET (0.6 MG TOTAL) BY MOUTH DAILY AS NEEDED (GOUT FLARE). 90 tablet 0  . lisinopril (PRINIVIL,ZESTRIL) 20 MG tablet Take 1 tablet (20 mg total) by mouth daily. 90 tablet 3  . tamoxifen (NOLVADEX) 20 MG tablet TAKE 1 TABLET BY MOUTH  DAILY 90 tablet 4  . venlafaxine XR (EFFEXOR-XR) 75 MG 24 hr capsule Take 1 capsule (75 mg total) by mouth daily with breakfast. 90 capsule 3  . sodium chloride (OCEAN) 0.65 % nasal spray Place into the nose.     No current facility-administered medications on file prior to visit.      Review of Systems  Constitutional: Negative  for activity change, appetite change, fatigue, fever and unexpected weight change.  HENT: Negative for congestion, ear pain, rhinorrhea, sinus pressure and sore throat.   Eyes: Negative for pain, redness and visual disturbance.  Respiratory: Negative for cough, shortness of breath and wheezing.   Cardiovascular: Negative for chest pain and palpitations.  Gastrointestinal: Negative for abdominal pain, blood in stool, constipation and diarrhea.  Endocrine: Negative for polydipsia and polyuria.  Genitourinary: Negative for dysuria, frequency and urgency.  Musculoskeletal: Positive for arthralgias. Negative for back pain and myalgias.       R ankle pain and swelling   Skin: Negative for pallor and rash.  Allergic/Immunologic: Negative for environmental allergies.  Neurological: Negative for dizziness, syncope and headaches.  Hematological: Negative for adenopathy. Does not bruise/bleed easily.   Psychiatric/Behavioral: Negative for decreased concentration and dysphoric mood. The patient is not nervous/anxious.        Objective:   Physical Exam  Constitutional: She appears well-developed and well-nourished. No distress.  HENT:  Head: Normocephalic.  Mouth/Throat: Oropharynx is clear and moist.  Eyes: Pupils are equal, round, and reactive to light. Conjunctivae and EOM are normal. No scleral icterus.  Neck: Normal range of motion. Neck supple.  Cardiovascular: Normal rate, regular rhythm and normal heart sounds.  Pulmonary/Chest: Breath sounds normal. No respiratory distress. She has no wheezes. She has no rales.  Musculoskeletal: She exhibits edema and tenderness. She exhibits no deformity.  R ankle edema and tenderness- moreso laterally  Erythema and warmth are mild Most pain with plantar flexion  Minimal on internal rotation and neg talar tilt  Nl perfusion and sensation No crepitus  Gait favors L foot due to pain   Lymphadenopathy:    She has no cervical adenopathy.  Neurological: She is alert. She displays no atrophy. No sensory deficit. She exhibits normal muscle tone. Coordination normal.  Skin: Skin is warm and dry. There is erythema.  Psychiatric: She has a normal mood and affect.          Assessment & Plan:   Problem List Items Addressed This Visit      Other   Ankle pain, right - Primary    Redness/swelling and hx of gout (some question about this)  Would like to get xray but our xray is down today and pt declines going to the [place] office for this due to having to return for work  She does have ortho f/u on wed of next wk Hx of osteopenia and foot fx in the past  Lab today for uric acid and cbc Trial of indocin for pain (with food)  Also norco for pm more severe pain  Will update with results Recommend elevation and cool compresses   Meds ordered this encounter  Medications  . indomethacin (INDOCIN) 50 MG capsule    Sig: Take 1 capsule (50  mg total) by mouth 3 (three) times daily with meals.    Dispense:  20 capsule    Refill:  0  . HYDROcodone-acetaminophen (NORCO/VICODIN) 5-325 MG tablet    Sig: Take 1 tablet by mouth at bedtime as needed for moderate pain or severe pain.    Dispense:  5 tablet    Refill:  0         Relevant Orders   CBC with Differential/Platelet   Uric acid   H/O: gout    With R ankle symptoms  Uric acid and cbc today  Trial of indocin       Relevant Orders   CBC  with Differential/Platelet   Uric acid

## 2017-12-22 NOTE — Telephone Encounter (Signed)
Pt reports right ankle swollen "twice it's size" x 2 weeks. Red, warm to touch, painful,10/10. Has been taking Ibuprofen and elevating at night; minimal relief.  Does not recall any injury. Stress fx of same foot 9/18. H/O gout. Denies calf pain, SOB. Same day appt made with Dr. Milinda Antisower as NPs full schedule.  Care advise given.  Reason for Disposition . [1] Redness AND [2] painful when touched AND [3] no fever  Answer Assessment - Initial Assessment Questions 1. LOCATION: "Which joint is swollen?"    Right ankle 2. ONSET: "When did the swelling start?"     X 2 weeks 3. SIZE: "How large is the swelling?"     Twice the size x 2 weeks 4. PAIN: "Is there any pain?" If so, ask: "How bad is it?" (Scale 1-10; or mild, moderate, severe)     10/10 5. CAUSE: "What do you think caused the swollen joint?"     Unsure 6. OTHER SYMPTOMS: "Do you have any other symptoms?" (e.g., fever, chest pain, difficulty breathing, calf pain)     Entire leg painful; redness at ankle, red and warm  Protocols used: ANKLE SWELLING-A-AH

## 2017-12-22 NOTE — Patient Instructions (Signed)
Continue cool compresses and elevate ankle  If you want to get an x[place] at the [place] please let me know   Try the indocin for pain (instead of ibuprofen or colchicine) You can take a norco at night if needed - caution of sedation and constipation   Follow up with orthopedics as planned   Lab today - uric acid and a cbc

## 2017-12-22 NOTE — Telephone Encounter (Signed)
She was seen in the office

## 2017-12-23 LAB — CBC WITH DIFFERENTIAL/PLATELET
BASOS: 0 %
Basophils Absolute: 0 10*3/uL (ref 0.0–0.2)
EOS (ABSOLUTE): 0 10*3/uL (ref 0.0–0.4)
Eos: 0 %
Hematocrit: 35.4 % (ref 34.0–46.6)
Hemoglobin: 12.2 g/dL (ref 11.1–15.9)
Immature Grans (Abs): 0 10*3/uL (ref 0.0–0.1)
Immature Granulocytes: 0 %
LYMPHS ABS: 1.6 10*3/uL (ref 0.7–3.1)
Lymphs: 20 %
MCH: 32.4 pg (ref 26.6–33.0)
MCHC: 34.5 g/dL (ref 31.5–35.7)
MCV: 94 fL (ref 79–97)
MONOS ABS: 0.7 10*3/uL (ref 0.1–0.9)
Monocytes: 9 %
NEUTROS ABS: 5.7 10*3/uL (ref 1.4–7.0)
Neutrophils: 71 %
PLATELETS: 319 10*3/uL (ref 150–379)
RBC: 3.77 x10E6/uL (ref 3.77–5.28)
RDW: 13.8 % (ref 12.3–15.4)
WBC: 8 10*3/uL (ref 3.4–10.8)

## 2017-12-23 LAB — URIC ACID: Uric Acid: 5.7 mg/dL (ref 2.5–7.1)

## 2017-12-28 DIAGNOSIS — S92324D Nondisplaced fracture of second metatarsal bone, right foot, subsequent encounter for fracture with routine healing: Secondary | ICD-10-CM | POA: Diagnosis not present

## 2018-01-04 DIAGNOSIS — E559 Vitamin D deficiency, unspecified: Secondary | ICD-10-CM | POA: Diagnosis not present

## 2018-01-05 DIAGNOSIS — M810 Age-related osteoporosis without current pathological fracture: Secondary | ICD-10-CM | POA: Diagnosis not present

## 2018-01-05 DIAGNOSIS — E559 Vitamin D deficiency, unspecified: Secondary | ICD-10-CM | POA: Diagnosis not present

## 2018-01-05 DIAGNOSIS — R5383 Other fatigue: Secondary | ICD-10-CM | POA: Diagnosis not present

## 2018-01-11 DIAGNOSIS — S92324D Nondisplaced fracture of second metatarsal bone, right foot, subsequent encounter for fracture with routine healing: Secondary | ICD-10-CM | POA: Diagnosis not present

## 2018-01-24 ENCOUNTER — Telehealth: Payer: Self-pay | Admitting: *Deleted

## 2018-01-24 NOTE — Telephone Encounter (Signed)
Tamoxifen refill request, does she need this medication refill?

## 2018-01-24 NOTE — Telephone Encounter (Signed)
She stopped her tamoxifen. RX not sent in.

## 2018-01-25 DIAGNOSIS — S92324D Nondisplaced fracture of second metatarsal bone, right foot, subsequent encounter for fracture with routine healing: Secondary | ICD-10-CM | POA: Diagnosis not present

## 2018-02-22 DIAGNOSIS — S92324D Nondisplaced fracture of second metatarsal bone, right foot, subsequent encounter for fracture with routine healing: Secondary | ICD-10-CM | POA: Diagnosis not present

## 2018-03-24 ENCOUNTER — Other Ambulatory Visit: Payer: Self-pay | Admitting: Internal Medicine

## 2018-03-24 DIAGNOSIS — I10 Essential (primary) hypertension: Secondary | ICD-10-CM

## 2018-04-03 ENCOUNTER — Ambulatory Visit (INDEPENDENT_AMBULATORY_CARE_PROVIDER_SITE_OTHER): Payer: 59 | Admitting: Internal Medicine

## 2018-04-03 ENCOUNTER — Encounter: Payer: Self-pay | Admitting: Internal Medicine

## 2018-04-03 VITALS — BP 124/78 | HR 101 | Temp 98.3°F | Ht 66.75 in | Wt 141.0 lb

## 2018-04-03 DIAGNOSIS — Z8739 Personal history of other diseases of the musculoskeletal system and connective tissue: Secondary | ICD-10-CM

## 2018-04-03 DIAGNOSIS — Z23 Encounter for immunization: Secondary | ICD-10-CM | POA: Diagnosis not present

## 2018-04-03 DIAGNOSIS — Z789 Other specified health status: Secondary | ICD-10-CM

## 2018-04-03 DIAGNOSIS — Z0000 Encounter for general adult medical examination without abnormal findings: Secondary | ICD-10-CM

## 2018-04-03 DIAGNOSIS — D0511 Intraductal carcinoma in situ of right breast: Secondary | ICD-10-CM | POA: Diagnosis not present

## 2018-04-03 DIAGNOSIS — I10 Essential (primary) hypertension: Secondary | ICD-10-CM

## 2018-04-03 DIAGNOSIS — M1A00X0 Idiopathic chronic gout, unspecified site, without tophus (tophi): Secondary | ICD-10-CM | POA: Diagnosis not present

## 2018-04-03 MED ORDER — VENLAFAXINE HCL ER 75 MG PO CP24: ORAL_CAPSULE | ORAL | 3 refills | Status: DC

## 2018-04-03 MED ORDER — LISINOPRIL 20 MG PO TABS: 20.0000 mg | ORAL_TABLET | Freq: Every day | ORAL | 3 refills | Status: DC

## 2018-04-03 NOTE — Assessment & Plan Note (Signed)
Controlled on Lisinopril, refilled today CBC and CMET today Reinforced DASH diet

## 2018-04-03 NOTE — Progress Notes (Signed)
Subjective:    Patient ID: Jane Gonzales, female    DOB: 07/24/1961, 57 y.o.   MRN: [id number]  HPI  Pt presents to the clinic today for her annual exam. She is also due to follow up chronic conditions.  Gout: Her last flare was many years ago. She takes Indomethacin as needed.  HTN: Her BP today is 124/78. She is taking Lisinopril as prescribed. There is no ECG on file.  Hx of Breast Cancer: s/p lumpectomy. In remission. She is no longer taking Tamoxifen, but is taking the Effexor as prescribed.   Flu: 05/2017 Tetanus: > 10 years ago Pap Smear: 07/2015 Mammogram: 10/2017 Colon Screening: never Vision Screening: as needed Dentist: annually  Diet: She does eat meat. She consumes fruits and veggies daily. She tries to avoid fried foods. She drinks mostly water. Exercise: Gym, 3 x week.   Review of Systems      Past Medical History:  Diagnosis Date  . 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 Chicken pox   . Kidney stones 2013  . Mammographic microcalcification 2013  . Personal history of radiation therapy 2014   BREAST CA  . Pregnancy examination or test, pregnancy unconfirmed 2014  . Shingles June 2015    Current Outpatient Medications  Medication Sig Dispense Refill  . Ascorbic Acid (VITAMIN C) 1000 MG tablet Take 1,000 mg by mouth every other day.     . Calcium Carbonate (CALCIUM 600 PO) Take by mouth.    . cholecalciferol (VITAMIN D) 1000 units tablet Take 1,000 Units by mouth daily.    . colchicine 0.6 MG tablet TAKE 1 TABLET (0.6 MG TOTAL) BY MOUTH DAILY AS NEEDED (GOUT FLARE). 90 tablet 0  . indomethacin (INDOCIN) 50 MG capsule Take 1 capsule (50 mg total) by mouth 3 (three) times daily with meals. 20 capsule 0  . lisinopril (PRINIVIL,ZESTRIL) 20 MG tablet Take 1 tablet (20 mg total) by mouth daily. 90 tablet 3  . venlafaxine XR (EFFEXOR-XR) 75 MG 24 hr capsule TAKE 1 CAPSULE BY MOUTH  DAILY  WITH BREAKFAST 90 capsule 3  . sodium chloride (OCEAN) 0.65 % nasal spray Place into the nose.     No current facility-administered medications for this visit.     No Known Allergies  Family History  Problem Relation Age of Onset  . Hyperlipidemia Father   . Heart disease Father   . Hypertension Father   . Stroke Paternal Grandmother   . Cancer Neg Hx   . Breast cancer Neg Hx     Social History   Socioeconomic History  . Marital status: Married    Spouse name: Not on file  . Number of children: Not on file  . Years of education: Not on file  . Highest education level: Not on file  Occupational History  . Not on file  Social Needs  . Financial resource strain: Not on file  . Food insecurity:    Worry: Not on file    Inability: Not on file  . Transportation needs:    Medical: Not on file    Non-medical: Not on file  Tobacco Use  . Smoking status: Current Every Day Smoker    Packs/day: 0.50    Years: 28.00    Pack years: 14.00    Types: Cigarettes  . Smokeless tobacco: Never Used  Substance and Sexual Activity  . Alcohol use: Yes  Alcohol/week: 3.6 oz    Types: 6 Glasses of wine per week    Comment: beer or wine---occasional  . Drug use: No  . Sexual activity: Yes  Lifestyle  . Physical activity:    Days per week: Not on file    Minutes per session: Not on file  . Stress: Not on file  Relationships  . Social connections:    Talks on phone: Not on file    Gets together: Not on file    Attends religious service: Not on file    Active member of club or organization: Not on file    Attends meetings of clubs or organizations: Not on file    Relationship status: Not on file  . Intimate partner violence:    Fear of current or ex partner: Not on file    Emotionally abused: Not on file    Physically abused: Not on file    Forced sexual activity: Not on file  Other Topics Concern  . Not on file  Social History Narrative  . Not on file      Constitutional: Denies fever, malaise, fatigue, headache or abrupt weight changes.  HEENT: Denies eye pain, eye redness, ear pain, ringing in the ears, wax buildup, runny nose, nasal congestion, bloody nose, or sore throat. Respiratory: Denies difficulty breathing, shortness of breath, cough or sputum production.   Cardiovascular: Denies chest pain, chest tightness, palpitations or swelling in the hands or feet.  Gastrointestinal: Denies abdominal pain, bloating, constipation, diarrhea or blood in the stool.  GU: Denies urgency, frequency, pain with urination, burning sensation, blood in urine, odor or discharge. Musculoskeletal: Denies decrease in range of motion, difficulty with gait, muscle pain or joint pain and swelling.  Skin: Denies redness, rashes, lesions or ulcercations.  Neurological: Denies dizziness, difficulty with memory, difficulty with speech or problems with balance and coordination.  Psych: Denies anxiety, depression, SI/HI.  No other specific complaints in a complete review of systems (except as listed in HPI above).  Objective:   Physical Exam   BP 124/78   Pulse (!) 101   Temp 98.3 F (36.8 C) (Oral)   Ht 5' 6.75" (1.695 m)   Wt 141 lb (64 kg)   LMP 05/20/2015   SpO2 97%   BMI 22.25 kg/m  Wt Readings from Last 3 Encounters:  04/03/18 141 lb (64 kg)  12/22/17 142 lb 12 oz (64.8 kg)  12/01/17 147 lb (66.7 kg)    General: Appears her stated age, well developed, well nourished in NAD. Skin: Warm, dry and intact.  HEENT: Head: normal shape and size; Eyes: sclera white, no icterus, conjunctiva pink, PERRLA and EOMs intact; Ears: Tm's gray and intact, normal light reflex;  Throat/Mouth: Teeth present, mucosa pink and moist, no exudate, lesions or ulcerations noted.  Neck:  Neck supple, trachea midline. No masses, lumps or thyromegaly present.  Cardiovascular: Normal rate and rhythm. S1,S2 noted.  No murmur, rubs or gallops noted. No JVD or BLE edema. No  carotid bruits noted. Pulmonary/Chest: Normal effort and positive vesicular breath sounds. No respiratory distress. No wheezes, rales or ronchi noted.  Abdomen: Soft and nontender. Normal bowel sounds. No distention or masses noted. Liver, spleen and kidneys non palpable. Musculoskeletal:  Strength 5/5 BUE/BLE. No difficulty with gait.  Neurological: Alert and oriented. Cranial nerves II-XII grossly intact. Coordination normal.  Psychiatric: Mood and affect normal. Behavior is normal. Judgment and thought content normal.     BMET    Component Value Date/Time  NA 141 03/29/2017 0947   K 4.5 03/29/2017 0947   CL 100 03/29/2017 0947   CO2 23 03/29/2017 0947   GLUCOSE 108 (H) 03/29/2017 0947   GLUCOSE 104 (H) 03/14/2017 1610   BUN 10 03/29/2017 0947   CREATININE 0.78 03/29/2017 0947   CALCIUM 9.8 03/29/2017 0947   GFRNONAA 86 03/29/2017 0947   GFRAA 100 03/29/2017 0947    Lipid Panel     Component Value Date/Time   CHOL 144 03/29/2017 0947   TRIG 115 03/29/2017 0947   HDL 53 03/29/2017 0947   CHOLHDL 2.7 03/29/2017 0947   LDLCALC 68 03/29/2017 0947    CBC    Component Value Date/Time   WBC 8.0 12/22/2017 0953   WBC 7.4 03/14/2017 1610   RBC 3.77 12/22/2017 0953   RBC 3.98 03/14/2017 1610   HGB 12.2 12/22/2017 0953   HCT 35.4 12/22/2017 0953   PLT 319 12/22/2017 0953   MCV 94 12/22/2017 0953   MCV 100 12/07/2012 0814   MCH 32.4 12/22/2017 0953   MCH 33.4 12/07/2012 0814   MCHC 34.5 12/22/2017 0953   MCHC 34.3 03/14/2017 1610   RDW 13.8 12/22/2017 0953   RDW 12.5 12/07/2012 0814   LYMPHSABS 1.6 12/22/2017 0953   LYMPHSABS 0.7 (L) 12/07/2012 0814   MONOABS 0.9 03/14/2017 1610   MONOABS 0.7 12/07/2012 0814   EOSABS 0.0 12/22/2017 0953   EOSABS 0.0 12/07/2012 0814   BASOSABS 0.0 12/22/2017 0953   BASOSABS 0.0 12/07/2012 0814    Hgb A1C Lab Results  Component Value Date   HGBA1C 5.4 03/29/2017           Assessment & Plan:   Preventative Health  Maintenance:  Encouraged her to get a flu shot in the fall Td booster today Pap smear due 2021 Mammogram UTD She declines colon cancer screening Encouraged her to consume a balanaced diet and exercise regimen Advised her to see an eye doctor and dentist annually Will check CBC, CMET, Lipid, Vit D, A1C, MMR titer today  RTC in 1 year, sooner if needed Webb Silversmith, NP

## 2018-04-03 NOTE — Patient Instructions (Signed)
Health Maintenance, Female Adopting a healthy lifestyle and getting preventive care can go a [place] way to promote health and wellness. Talk with your health care provider about what schedule of regular examinations is right for you. This is a good chance for you to check in with your provider about disease prevention and staying healthy. In between checkups, there are plenty of things you can do on your own. Experts have done a lot of research about which lifestyle changes and preventive measures are most likely to keep you healthy. Ask your health care provider for more information. Weight and diet Eat a healthy diet  Be sure to include plenty of vegetables, fruits, low-fat dairy products, and lean protein.  Do not eat a lot of foods high in solid fats, added sugars, or salt.  Get regular exercise. This is one of the most important things you can do for your health. ? Most adults should exercise for at least 150 minutes each week. The exercise should increase your heart rate and make you sweat (moderate-intensity exercise). ? Most adults should also do strengthening exercises at least twice a week. This is in addition to the moderate-intensity exercise.  Maintain a healthy weight  Body mass index (BMI) is a measurement that can be used to identify possible weight problems. It estimates body fat based on height and weight. Your health care provider can help determine your BMI and help you achieve or maintain a healthy weight.  For females 20 years of age and older: ? A BMI below 18.5 is considered underweight. ? A BMI of 18.5 to 24.9 is normal. ? A BMI of 25 to 29.9 is considered overweight. ? A BMI of 30 and above is considered obese.  Watch levels of cholesterol and blood lipids  You should start having your blood tested for lipids and cholesterol at 55 years of age, then have this test every 5 years.  You may need to have your cholesterol levels checked more often if: ? Your lipid or  cholesterol levels are high. ? You are older than 55 years of age. ? You are at high risk for heart disease.  Cancer screening Lung Cancer  Lung cancer screening is recommended for adults 55-80 years old who are at high risk for lung cancer because of a history of smoking.  A yearly low-dose CT scan of the lungs is recommended for people who: ? Currently smoke. ? Have quit within the past 15 years. ? Have at least a 30-pack-year history of smoking. A pack year is smoking an average of one pack of cigarettes a day for 1 year.  Yearly screening should continue until it has been 15 years since you quit.  Yearly screening should stop if you develop a health problem that would prevent you from having lung cancer treatment.  Breast Cancer  Practice breast self-awareness. This means understanding how your breasts normally appear and feel.  It also means doing regular breast self-exams. Let your health care provider know about any changes, no matter how small.  If you are in your 20s or 30s, you should have a clinical breast exam (CBE) by a health care provider every 1-3 years as part of a regular health exam.  If you are 40 or older, have a CBE every year. Also consider having a breast X-ray (mammogram) every year.  If you have a family history of breast cancer, talk to your health care provider about genetic screening.  If you are at high risk   for breast cancer, talk to your health care provider about having an MRI and a mammogram every year.  Breast cancer gene (BRCA) assessment is recommended for women who have family members with BRCA-related cancers. BRCA-related cancers include: ? Breast. ? Ovarian. ? Tubal. ? Peritoneal cancers.  Results of the assessment will determine the need for genetic counseling and BRCA1 and BRCA2 testing.  Cervical Cancer Your health care provider may recommend that you be screened regularly for cancer of the pelvic organs (ovaries, uterus, and  vagina). This screening involves a pelvic examination, including checking for microscopic changes to the surface of your cervix (Pap test). You may be encouraged to have this screening done every 3 years, beginning at age 22.  For women ages 56-65, health care providers may recommend pelvic exams and Pap testing every 3 years, or they may recommend the Pap and pelvic exam, combined with testing for human papilloma virus (HPV), every 5 years. Some types of HPV increase your risk of cervical cancer. Testing for HPV may also be done on women of any age with unclear Pap test results.  Other health care providers may not recommend any screening for nonpregnant women who are considered low risk for pelvic cancer and who do not have symptoms. Ask your health care provider if a screening pelvic exam is right for you.  If you have had past treatment for cervical cancer or a condition that could lead to cancer, you need Pap tests and screening for cancer for at least 20 years after your treatment. If Pap tests have been discontinued, your risk factors (such as having a new sexual partner) need to be reassessed to determine if screening should resume. Some women have medical problems that increase the chance of getting cervical cancer. In these cases, your health care provider may recommend more frequent screening and Pap tests.  Colorectal Cancer  This type of cancer can be detected and often prevented.  Routine colorectal cancer screening usually begins at 54 years of age and continues through 55 years of age.  Your health care provider may recommend screening at an earlier age if you have risk factors for colon cancer.  Your health care provider may also recommend using home test kits to check for hidden blood in the stool.  A small camera at the end of a tube can be used to examine your colon directly (sigmoidoscopy or colonoscopy). This is done to check for the earliest forms of colorectal  cancer.  Routine screening usually begins at age 33.  Direct examination of the colon should be repeated every 5-10 years through 55 years of age. However, you may need to be screened more often if early forms of precancerous polyps or small growths are found.  Skin Cancer  Check your skin from head to toe regularly.  Tell your health care provider about any new moles or changes in moles, especially if there is a change in a mole's shape or color.  Also tell your health care provider if you have a mole that is larger than the size of a pencil eraser.  Always use sunscreen. Apply sunscreen liberally and repeatedly throughout the day.  Protect yourself by wearing [place] sleeves, pants, a wide-brimmed hat, and sunglasses whenever you are outside.  Heart disease, diabetes, and high blood pressure  High blood pressure causes heart disease and increases the risk of stroke. High blood pressure is more likely to develop in: ? People who have blood pressure in the high end of  the normal range (130-139/85-89 mm Hg). ? People who are overweight or obese. ? People who are African American.  If you are 21-29 years of age, have your blood pressure checked every 3-5 years. If you are 3 years of age or older, have your blood pressure checked every year. You should have your blood pressure measured twice-once when you are at a hospital or clinic, and once when you are not at a hospital or clinic. Record the average of the two measurements. To check your blood pressure when you are not at a hospital or clinic, you can use: ? An automated blood pressure machine at a pharmacy. ? A home blood pressure monitor.  If you are between 17 years and 37 years old, ask your health care provider if you should take aspirin to prevent strokes.  Have regular diabetes screenings. This involves taking a blood sample to check your fasting blood sugar level. ? If you are at a normal weight and have a low risk for diabetes,  have this test once every three years after 55 years of age. ? If you are overweight and have a high risk for diabetes, consider being tested at a younger age or more often. Preventing infection Hepatitis B  If you have a higher risk for hepatitis B, you should be screened for this virus. You are considered at high risk for hepatitis B if: ? You were born in a country where hepatitis B is common. Ask your health care provider which countries are considered high risk. ? Your parents were born in a high-risk country, and you have not been immunized against hepatitis B (hepatitis B vaccine). ? You have HIV or AIDS. ? You use needles to inject street drugs. ? You live with someone who has hepatitis B. ? You have had sex with someone who has hepatitis B. ? You get hemodialysis treatment. ? You take certain medicines for conditions, including cancer, organ transplantation, and autoimmune conditions.  Hepatitis C  Blood testing is recommended for: ? Everyone born from [birth year] through 1963. ? Anyone with known risk factors for hepatitis C.  Sexually transmitted infections (STIs)  You should be screened for sexually transmitted infections (STIs) including gonorrhea and chlamydia if: ? You are sexually active and are younger than 55 years of age. ? You are older than 55 years of age and your health care provider tells you that you are at risk for this type of infection. ? Your sexual activity has changed since you were last screened and you are at an increased risk for chlamydia or gonorrhea. Ask your health care provider if you are at risk.  If you do not have HIV, but are at risk, it may be recommended that you take a prescription medicine daily to prevent HIV infection. This is called pre-exposure prophylaxis (PrEP). You are considered at risk if: ? You are sexually active and do not regularly use condoms or know the HIV status of your partner(s). ? You take drugs by injection. ? You are  sexually active with a partner who has HIV.  Talk with your health care provider about whether you are at high risk of being infected with HIV. If you choose to begin PrEP, you should first be tested for HIV. You should then be tested every 3 months for as [place] as you are taking PrEP. Pregnancy  If you are premenopausal and you may become pregnant, ask your health care provider about preconception counseling.  If you may become  pregnant, take 400 to 800 micrograms (mcg) of folic acid every day.  If you want to prevent pregnancy, talk to your health care provider about birth control (contraception). Osteoporosis and menopause  Osteoporosis is a disease in which the bones lose minerals and strength with aging. This can result in serious bone fractures. Your risk for osteoporosis can be identified using a bone density scan.  If you are 13 years of age or older, or if you are at risk for osteoporosis and fractures, ask your health care provider if you should be screened.  Ask your health care provider whether you should take a calcium or vitamin D supplement to lower your risk for osteoporosis.  Menopause may have certain physical symptoms and risks.  Hormone replacement therapy may reduce some of these symptoms and risks. Talk to your health care provider about whether hormone replacement therapy is right for you. Follow these instructions at home:  Schedule regular health, dental, and eye exams.  Stay current with your immunizations.  Do not use any tobacco products including cigarettes, chewing tobacco, or electronic cigarettes.  If you are pregnant, do not drink alcohol.  If you are breastfeeding, limit how much and how often you drink alcohol.  Limit alcohol intake to no more than 1 drink per day for nonpregnant women. One drink equals 12 ounces of beer, 5 ounces of wine, or 1 ounces of hard liquor.  Do not use street drugs.  Do not share needles.  Ask your health care  provider for help if you need support or information about quitting drugs.  Tell your health care provider if you often feel depressed.  Tell your health care provider if you have ever been abused or do not feel safe at home. This information is not intended to replace advice given to you by your health care provider. Make sure you discuss any questions you have with your health care provider. Document Released: 03/01/2011 Document Revised: 01/22/2016 Document Reviewed: 05/20/2015 Elsevier Interactive Patient Education  [employer].

## 2018-04-03 NOTE — Assessment & Plan Note (Signed)
Uric acid level today Continue Indomethacin prn 

## 2018-04-03 NOTE — Assessment & Plan Note (Signed)
In remission Off Tamoxifen Continue Effexor for menopausal symptoms, refilled today

## 2018-04-04 LAB — COMPREHENSIVE METABOLIC PANEL
A/G RATIO: 2 (ref 1.2–2.2)
ALK PHOS: 99 IU/L (ref 39–117)
ALT: 18 IU/L (ref 0–32)
AST: 19 IU/L (ref 0–40)
Albumin: 4.3 g/dL (ref 3.5–5.5)
BILIRUBIN TOTAL: 0.2 mg/dL (ref 0.0–1.2)
BUN/Creatinine Ratio: 20 (ref 9–23)
BUN: 17 mg/dL (ref 6–24)
CHLORIDE: 101 mmol/L (ref 96–106)
CO2: 24 mmol/L (ref 20–29)
Calcium: 10.2 mg/dL (ref 8.7–10.2)
Creatinine, Ser: 0.83 mg/dL (ref 0.57–1.00)
GFR calc Af Amer: 92 mL/min/{1.73_m2} (ref >59)
GFR calc non Af Amer: 80 mL/min/{1.73_m2} (ref >59)
GLUCOSE: 103 mg/dL — AB (ref 65–99)
Globulin, Total: 2.2 g/dL (ref 1.5–4.5)
POTASSIUM: 4.4 mmol/L (ref 3.5–5.2)
Sodium: 139 mmol/L (ref 134–144)
Total Protein: 6.5 g/dL (ref 6.0–8.5)

## 2018-04-04 LAB — CBC
Hematocrit: 42 % (ref 34.0–46.6)
Hemoglobin: 14.4 g/dL (ref 11.1–15.9)
MCH: 33 pg (ref 26.6–33.0)
MCHC: 34.3 g/dL (ref 31.5–35.7)
MCV: 96 fL (ref 79–97)
PLATELETS: 308 10*3/uL (ref 150–450)
RBC: 4.36 x10E6/uL (ref 3.77–5.28)
RDW: 14.2 % (ref 12.3–15.4)
WBC: 7.9 10*3/uL (ref 3.4–10.8)

## 2018-04-04 LAB — MEASLES/MUMPS/RUBELLA IMMUNITY
MUMPS ABS, IGG: 74.4 [AU]/ml (ref >10.9)
Rubella Antibodies, IGG: 2.02 index (ref >0.99)

## 2018-04-04 LAB — URIC ACID: URIC ACID: 5.8 mg/dL (ref 2.5–7.1)

## 2018-04-04 LAB — LIPID PANEL
CHOLESTEROL TOTAL: 154 mg/dL (ref 100–199)
Chol/HDL Ratio: 3.1 ratio (ref 0.0–4.4)
HDL: 49 mg/dL (ref >39)
LDL Calculated: 60 mg/dL (ref 0–99)
TRIGLYCERIDES: 225 mg/dL — AB (ref 0–149)
VLDL Cholesterol Cal: 45 mg/dL — ABNORMAL HIGH (ref 5–40)

## 2018-04-04 LAB — VITAMIN D 25 HYDROXY (VIT D DEFICIENCY, FRACTURES): Vit D, 25-Hydroxy: 50.2 ng/mL (ref 30.0–100.0)

## 2018-04-04 LAB — HEMOGLOBIN A1C
Est. average glucose Bld gHb Est-mCnc: 105 mg/dL
Hgb A1c MFr Bld: 5.3 % (ref 4.8–5.6)

## 2018-04-05 NOTE — Addendum Note (Signed)
Addended by: Roena MaladyEVONTENNO, Merridith Dershem Y on: 04/05/2018 09:02 AM   Modules accepted: Orders

## 2018-04-12 ENCOUNTER — Ambulatory Visit (INDEPENDENT_AMBULATORY_CARE_PROVIDER_SITE_OTHER): Payer: 59

## 2018-04-12 ENCOUNTER — Telehealth: Payer: Self-pay | Admitting: Internal Medicine

## 2018-04-12 DIAGNOSIS — Z23 Encounter for immunization: Secondary | ICD-10-CM

## 2018-04-12 NOTE — Telephone Encounter (Signed)
Pt dropped off health screening appeal form In [facility] up front  Pt stated she needs to turn in please call when ready [phone]

## 2018-04-12 NOTE — Telephone Encounter (Signed)
Don't see this in my inbox. It might still be in the RX tower.

## 2018-04-18 NOTE — Telephone Encounter (Signed)
I still don't have this form. Can we try to locate.

## 2018-04-19 NOTE — Telephone Encounter (Signed)
Done, given back to MYD 

## 2018-04-19 NOTE — Telephone Encounter (Signed)
Placed in your box 

## 2018-04-20 NOTE — Telephone Encounter (Signed)
Left detailed msg on VM per HIPAA  Form left in front office for pick up 

## 2018-05-25 ENCOUNTER — Ambulatory Visit (INDEPENDENT_AMBULATORY_CARE_PROVIDER_SITE_OTHER): Payer: 59

## 2018-05-25 DIAGNOSIS — Z23 Encounter for immunization: Secondary | ICD-10-CM

## 2018-08-17 ENCOUNTER — Emergency Department: Payer: 59

## 2018-08-17 ENCOUNTER — Emergency Department
Admission: EM | Admit: 2018-08-17 | Discharge: 2018-08-17 | Disposition: A | Discharge status: Home / Self Care | Payer: 59 | Attending: Emergency Medicine | Admitting: Emergency Medicine

## 2018-08-17 DIAGNOSIS — F17210 Nicotine dependence, cigarettes, uncomplicated: Secondary | ICD-10-CM | POA: Diagnosis not present

## 2018-08-17 DIAGNOSIS — W19XXXA Unspecified fall, initial encounter: Secondary | ICD-10-CM | POA: Diagnosis not present

## 2018-08-17 DIAGNOSIS — M25569 Pain in unspecified knee: Secondary | ICD-10-CM | POA: Diagnosis not present

## 2018-08-17 DIAGNOSIS — M25561 Pain in right knee: Secondary | ICD-10-CM | POA: Diagnosis not present

## 2018-08-17 DIAGNOSIS — I10 Essential (primary) hypertension: Secondary | ICD-10-CM | POA: Diagnosis not present

## 2018-08-17 DIAGNOSIS — Z853 Personal history of malignant neoplasm of breast: Secondary | ICD-10-CM | POA: Diagnosis not present

## 2018-08-17 DIAGNOSIS — Z79899 Other long term (current) drug therapy: Secondary | ICD-10-CM | POA: Diagnosis not present

## 2018-08-17 DIAGNOSIS — R52 Pain, unspecified: Secondary | ICD-10-CM | POA: Diagnosis not present

## 2018-08-17 MED ORDER — MELOXICAM 15 MG PO TABS: 15.0000 mg | ORAL_TABLET | Freq: Every day | ORAL | 1 refills | Status: AC

## 2018-08-17 NOTE — ED Notes (Signed)
Patient discharged to home per MD order. Patient in stable condition, and deemed medically cleared by ED provider for discharge. Discharge instructions reviewed with patient/family using "Teach Back"; verbalized understanding of medication education and administration, and information about follow-up care. Denies further concerns. ° °
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
## 2018-08-17 NOTE — ED Provider Notes (Signed)
[facility] Emergency Department Provider Note  ____________________________________________  Time seen: Approximately 11:31 PM  I have reviewed the triage vital signs and the nursing notes.   HISTORY  Chief Complaint Fall    HPI Jane Gonzales is a 57 y.o. female presents to the emergency department with right anterior knee pain after patient reportedly fell off a stepladder 1-2 steps while trying to change a light bulb above her bathtub.  Patient has had a prior ACL repair 15 years ago and patient became concerned and she noticed soft tissue swelling.  Patient did not hit her head during injury.  No numbness or tingling in the upper or lower extremities.  No abrasions or lacerations were sustained.  No alleviating measures have been attempted.   Past Medical History:  Diagnosis Date  . 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 Chicken pox   . Kidney stones 2013  . Mammographic microcalcification 2013  . Personal history of radiation therapy 2014   BREAST CA  . Pregnancy examination or test, pregnancy unconfirmed 2014  . Shingles June 2015    Patient Active Problem List   Diagnosis Date Noted  . H/O: gout 12/22/2017  . 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 Essential hypertension 02/18/2014    Past Surgical History:  Procedure Laterality Date  . BREAST EXCISIONAL BIOPSY Right 09/13/2014   breast cancer dcis  . BREAST LUMPECTOMY WITH SENTINEL LYMPH NODE BIOPSY Right 09/13/2012   18 mm Grade II DCIS; negative margin resection, neg SLN. ER/PR +/  . KNEE SURGERY  2007   ACL  . LAPAROSCOPY  1988  . LITHOTRIPSY  1992    Prior to Admission medications   Medication Sig Start Date End Date Taking? Authorizing Provider  Ascorbic Acid (VITAMIN C) 1000 MG tablet Take 1,000 mg by mouth every other day.     [provider]  Calcium Carbonate (CALCIUM 600 PO) Take  by mouth.    [provider]  cholecalciferol (VITAMIN D) 1000 units tablet Take 1,000 Units by mouth daily.    [provider]  colchicine 0.6 MG tablet TAKE 1 TABLET (0.6 MG TOTAL) BY MOUTH DAILY AS NEEDED (GOUT FLARE). 04/11/17   Eustaquio BoydenGutierrez, Javier, MD  indomethacin (INDOCIN) 50 MG capsule Take 1 capsule (50 mg total) by mouth 3 (three) times daily with meals. 12/22/17   Tower, Audrie GallusMarne A, MD  lisinopril (PRINIVIL,ZESTRIL) 20 MG tablet Take 1 tablet (20 mg total) by mouth daily. 04/03/18   Lorre MunroeBaity, Regina W, NP  meloxicam (MOBIC) 15 MG tablet Take 1 tablet (15 mg total) by mouth daily for 7 days. 08/17/18 08/24/18  Orvil FeilWoods, Jaclyn M, PA-C  sodium chloride (OCEAN) 0.65 % nasal spray Place into the nose. 10/23/17 10/30/17  [provider]  venlafaxine XR (EFFEXOR-XR) 75 MG 24 hr capsule TAKE 1 CAPSULE BY MOUTH  DAILY WITH BREAKFAST 04/03/18   Lorre MunroeBaity, Regina W, NP    Allergies Patient has no known allergies.  Family History  Problem Relation Age of Onset  . Hyperlipidemia Father   . Heart disease Father   . Hypertension Father   . Stroke Paternal Grandmother   . Cancer Neg Hx   . Breast cancer Neg Hx     Social History Social History   Tobacco Use  . Smoking status: Current Every Day Smoker    Packs/day: 0.50    Years: 28.00  Pack years: 14.00    Types: Cigarettes  . Smokeless tobacco: Never Used  Substance Use Topics  . Alcohol use: Yes    Alcohol/week: 6.0 standard drinks    Types: 6 Glasses of wine per week    Comment: beer or wine---occasional  . Drug use: No     Review of Systems  Constitutional: No fever/chills Eyes: No visual changes. No discharge ENT: No upper respiratory complaints. Cardiovascular: no chest pain. Respiratory: no cough. No SOB. Gastrointestinal: No abdominal pain.  No nausea, no vomiting.  No diarrhea.  No constipation. Musculoskeletal: Patient has right knee pain.  Skin: Negative for rash, abrasions, lacerations,  ecchymosis. Neurological: Negative for headaches, focal weakness or numbness.  ____________________________________________   PHYSICAL EXAM:  VITAL SIGNS: ED Triage Vitals  Enc Vitals Group     BP 08/17/18 2220 128/86     Pulse Rate 08/17/18 2220 (!) 106     Resp 08/17/18 2220 17     Temp 08/17/18 2220 97.7 F (36.5 C)     Temp src --      SpO2 08/17/18 2220 94 %     Weight 08/17/18 2221 135 lb (61.2 kg)     Height 08/17/18 2221 5' 6.5" (1.689 m)     Head Circumference --      Peak Flow --      Pain Score 08/17/18 2220 2     Pain Loc --      Pain Edu? --      Excl. in GC? --      Constitutional: Alert and oriented. Well appearing and in no acute distress. Eyes: Conjunctivae are normal. PERRL. EOMI. Head: Atraumatic. Cardiovascular: Normal rate, regular rhythm. Normal S1 and S2.  Good peripheral circulation. Respiratory: Normal respiratory effort without tachypnea or retractions. Lungs CTAB. Good air entry to the bases with no decreased or absent breath sounds. Musculoskeletal: 5 out of 5 strength in the upper and lower extremities bilaterally and symmetrically. Patient is able to perform full range of motion at the right knee.  Patient does have some loss of peripatellar dimpling in comparison with the left knee.  Patient has right-sided popliteal fullness.  No deficits with provocative ligamentous testing.  Palpable dorsalis pedis pulse bilaterally and symmetrically. Neurologic:  Normal speech and language. No gross focal neurologic deficits are appreciated.  Skin:  Skin is warm, dry and intact. No rash noted. Psychiatric: Mood and affect are normal. Speech and behavior are normal. Patient exhibits appropriate insight and judgement.   ____________________________________________   LABS (all labs ordered are listed, but only abnormal results are displayed)  Labs Reviewed - No data to  display ____________________________________________  EKG   ____________________________________________  RADIOLOGY I personally viewed and evaluated these images as part of my medical decision making, as well as reviewing the written report by the radiologist.  Dg Knee Complete 4 Views Right  Result Date: 08/17/2018 CLINICAL DATA:  Right knee pain after fall from step ladder. EXAM: RIGHT KNEE - COMPLETE 4+ VIEW COMPARISON:  None. FINDINGS: Postoperative changes consistent with previous anterior cruciate ligament repair. Anchor screws appear intact. Osteochondral defect in the medial femoral condyle could represent degenerative change or avascular necrosis. Possible loose body in the anterior joint space. No evidence of acute fracture. No significant effusion. No destructive or expansile bone lesions. IMPRESSION: Postoperative changes consistent with previous anterior cruciate ligament repair. Osteochondral defect in the medial femoral condyle. Possible loose body in the anterior joint space. No acute bony abnormalities. Electronically Signed  By: Burman NievesWilliam  Stevens M.D.   On: 08/17/2018 22:44    ____________________________________________    PROCEDURES  Procedure(s) performed:    Procedures    Medications - No data to display   ____________________________________________   INITIAL IMPRESSION / ASSESSMENT AND PLAN / ED COURSE  Pertinent labs & imaging results that were available during my care of the patient were reviewed by me and considered in my medical decision making (see chart for details).  Review of the [place] CSRS was performed in accordance of the [facility] prior to dispensing any controlled drugs.    Assessment and Plan:  Fall Patient presents to the emergency department with acute right knee pain after a fall that occurred earlier this evening.  No acute abnormalities were visualized on x-ray.  Patient was discharged with meloxicam and crutches were provided.  Patient  reported that there was no other reason why she presented to the emergency department tonight.  Triage note noted.  All patient questions were answered.   ____________________________________________  FINAL CLINICAL IMPRESSION(S) / ED DIAGNOSES  Final diagnoses:  Fall, initial encounter      NEW MEDICATIONS STARTED DURING THIS VISIT:  ED Discharge Orders         Ordered    meloxicam (MOBIC) 15 MG tablet  Daily     08/17/18 2327              This chart was dictated using voice recognition software/Dragon. Despite best efforts to proofread, errors can occur which can change the meaning. Any change was purely unintentional.    Orvil FeilWoods, Jaclyn M, PA-C 08/17/18 2337    Arnaldo NatalMalinda, Paul F, MD 08/18/18 2308

## 2018-08-17 NOTE — ED Triage Notes (Signed)
Patient reports she fell off step-ladder while changing a lightbulbs. Patient c/o right knee pain. Patient reports hx of ACL surgery to same knee.

## 2018-09-06 DIAGNOSIS — M2341 Loose body in knee, right knee: Secondary | ICD-10-CM | POA: Diagnosis not present

## 2018-09-11 DIAGNOSIS — M25561 Pain in right knee: Secondary | ICD-10-CM | POA: Diagnosis not present

## 2018-09-18 DIAGNOSIS — I10 Essential (primary) hypertension: Secondary | ICD-10-CM | POA: Diagnosis not present

## 2018-09-18 DIAGNOSIS — H6500 Acute serous otitis media, unspecified ear: Secondary | ICD-10-CM | POA: Diagnosis not present

## 2018-09-18 DIAGNOSIS — H6121 Impacted cerumen, right ear: Secondary | ICD-10-CM | POA: Diagnosis not present

## 2018-09-18 DIAGNOSIS — H6980 Other specified disorders of Eustachian tube, unspecified ear: Secondary | ICD-10-CM | POA: Diagnosis not present

## 2018-10-03 DIAGNOSIS — H6500 Acute serous otitis media, unspecified ear: Secondary | ICD-10-CM | POA: Diagnosis not present

## 2018-10-03 DIAGNOSIS — H6980 Other specified disorders of Eustachian tube, unspecified ear: Secondary | ICD-10-CM | POA: Diagnosis not present

## 2018-10-04 DIAGNOSIS — S83241A Other tear of medial meniscus, current injury, right knee, initial encounter: Secondary | ICD-10-CM | POA: Diagnosis not present

## 2018-10-04 DIAGNOSIS — G8918 Other acute postprocedural pain: Secondary | ICD-10-CM | POA: Diagnosis not present

## 2018-10-04 DIAGNOSIS — S83251A Bucket-handle tear of lateral meniscus, current injury, right knee, initial encounter: Secondary | ICD-10-CM | POA: Diagnosis not present

## 2018-10-04 DIAGNOSIS — S83281A Other tear of lateral meniscus, current injury, right knee, initial encounter: Secondary | ICD-10-CM | POA: Diagnosis not present

## 2018-10-04 DIAGNOSIS — S83511A Sprain of anterior cruciate ligament of right knee, initial encounter: Secondary | ICD-10-CM | POA: Diagnosis not present

## 2018-10-17 DIAGNOSIS — R262 Difficulty in walking, not elsewhere classified: Secondary | ICD-10-CM | POA: Diagnosis not present

## 2018-10-17 DIAGNOSIS — M25461 Effusion, right knee: Secondary | ICD-10-CM | POA: Diagnosis not present

## 2018-10-17 DIAGNOSIS — M25661 Stiffness of right knee, not elsewhere classified: Secondary | ICD-10-CM | POA: Diagnosis not present

## 2018-10-24 DIAGNOSIS — R262 Difficulty in walking, not elsewhere classified: Secondary | ICD-10-CM | POA: Diagnosis not present

## 2018-10-24 DIAGNOSIS — M25661 Stiffness of right knee, not elsewhere classified: Secondary | ICD-10-CM | POA: Diagnosis not present

## 2018-10-24 DIAGNOSIS — M25461 Effusion, right knee: Secondary | ICD-10-CM | POA: Diagnosis not present

## 2018-10-26 DIAGNOSIS — M25461 Effusion, right knee: Secondary | ICD-10-CM | POA: Diagnosis not present

## 2018-10-26 DIAGNOSIS — M25661 Stiffness of right knee, not elsewhere classified: Secondary | ICD-10-CM | POA: Diagnosis not present

## 2018-10-26 DIAGNOSIS — R262 Difficulty in walking, not elsewhere classified: Secondary | ICD-10-CM | POA: Diagnosis not present

## 2018-10-30 ENCOUNTER — Other Ambulatory Visit: Payer: Self-pay

## 2018-10-30 DIAGNOSIS — Z1231 Encounter for screening mammogram for malignant neoplasm of breast: Secondary | ICD-10-CM

## 2018-10-31 DIAGNOSIS — M25461 Effusion, right knee: Secondary | ICD-10-CM | POA: Diagnosis not present

## 2018-10-31 DIAGNOSIS — M6281 Muscle weakness (generalized): Secondary | ICD-10-CM | POA: Diagnosis not present

## 2018-10-31 DIAGNOSIS — M25661 Stiffness of right knee, not elsewhere classified: Secondary | ICD-10-CM | POA: Diagnosis not present

## 2018-11-02 DIAGNOSIS — M25661 Stiffness of right knee, not elsewhere classified: Secondary | ICD-10-CM | POA: Diagnosis not present

## 2018-11-02 DIAGNOSIS — R262 Difficulty in walking, not elsewhere classified: Secondary | ICD-10-CM | POA: Diagnosis not present

## 2018-11-02 DIAGNOSIS — M25461 Effusion, right knee: Secondary | ICD-10-CM | POA: Diagnosis not present

## 2018-11-07 DIAGNOSIS — M25661 Stiffness of right knee, not elsewhere classified: Secondary | ICD-10-CM | POA: Diagnosis not present

## 2018-11-07 DIAGNOSIS — R262 Difficulty in walking, not elsewhere classified: Secondary | ICD-10-CM | POA: Diagnosis not present

## 2018-11-07 DIAGNOSIS — M25461 Effusion, right knee: Secondary | ICD-10-CM | POA: Diagnosis not present

## 2018-11-09 DIAGNOSIS — R262 Difficulty in walking, not elsewhere classified: Secondary | ICD-10-CM | POA: Diagnosis not present

## 2018-11-09 DIAGNOSIS — M25461 Effusion, right knee: Secondary | ICD-10-CM | POA: Diagnosis not present

## 2018-11-09 DIAGNOSIS — M25661 Stiffness of right knee, not elsewhere classified: Secondary | ICD-10-CM | POA: Diagnosis not present

## 2018-11-14 DIAGNOSIS — R262 Difficulty in walking, not elsewhere classified: Secondary | ICD-10-CM | POA: Diagnosis not present

## 2018-11-14 DIAGNOSIS — M25661 Stiffness of right knee, not elsewhere classified: Secondary | ICD-10-CM | POA: Diagnosis not present

## 2018-11-14 DIAGNOSIS — M25461 Effusion, right knee: Secondary | ICD-10-CM | POA: Diagnosis not present

## 2018-11-16 DIAGNOSIS — R262 Difficulty in walking, not elsewhere classified: Secondary | ICD-10-CM | POA: Diagnosis not present

## 2018-11-16 DIAGNOSIS — M25461 Effusion, right knee: Secondary | ICD-10-CM | POA: Diagnosis not present

## 2018-11-16 DIAGNOSIS — M25661 Stiffness of right knee, not elsewhere classified: Secondary | ICD-10-CM | POA: Diagnosis not present

## 2018-11-21 DIAGNOSIS — R262 Difficulty in walking, not elsewhere classified: Secondary | ICD-10-CM | POA: Diagnosis not present

## 2018-11-21 DIAGNOSIS — M25461 Effusion, right knee: Secondary | ICD-10-CM | POA: Diagnosis not present

## 2018-11-21 DIAGNOSIS — M25661 Stiffness of right knee, not elsewhere classified: Secondary | ICD-10-CM | POA: Diagnosis not present

## 2018-11-30 DIAGNOSIS — M25661 Stiffness of right knee, not elsewhere classified: Secondary | ICD-10-CM | POA: Diagnosis not present

## 2018-11-30 DIAGNOSIS — R262 Difficulty in walking, not elsewhere classified: Secondary | ICD-10-CM | POA: Diagnosis not present

## 2018-11-30 DIAGNOSIS — M25461 Effusion, right knee: Secondary | ICD-10-CM | POA: Diagnosis not present

## 2018-12-05 DIAGNOSIS — M6281 Muscle weakness (generalized): Secondary | ICD-10-CM | POA: Diagnosis not present

## 2018-12-05 DIAGNOSIS — M25661 Stiffness of right knee, not elsewhere classified: Secondary | ICD-10-CM | POA: Diagnosis not present

## 2018-12-05 DIAGNOSIS — M25461 Effusion, right knee: Secondary | ICD-10-CM | POA: Diagnosis not present

## 2018-12-07 ENCOUNTER — Ambulatory Visit: Payer: 59 | Admitting: General Surgery

## 2019-04-04 ENCOUNTER — Ambulatory Visit
Admission: RE | Admit: 2019-04-04 | Discharge: 2019-04-04 | Disposition: A | Discharge status: Home / Self Care | Payer: 59 | Source: Ambulatory Visit | Attending: General Surgery | Admitting: General Surgery

## 2019-04-04 DIAGNOSIS — Z1231 Encounter for screening mammogram for malignant neoplasm of breast: Secondary | ICD-10-CM | POA: Diagnosis not present

## 2019-04-10 ENCOUNTER — Ambulatory Visit: Payer: 59 | Admitting: General Surgery

## 2019-04-26 ENCOUNTER — Other Ambulatory Visit: Payer: Self-pay | Admitting: Internal Medicine

## 2019-04-26 DIAGNOSIS — I10 Essential (primary) hypertension: Secondary | ICD-10-CM

## 2019-05-09 ENCOUNTER — Encounter: Payer: Self-pay | Admitting: *Deleted

## 2019-07-16 ENCOUNTER — Other Ambulatory Visit: Payer: Self-pay | Admitting: Internal Medicine

## 2019-07-16 DIAGNOSIS — I10 Essential (primary) hypertension: Secondary | ICD-10-CM

## 2019-10-05 ENCOUNTER — Other Ambulatory Visit: Payer: Self-pay | Admitting: Internal Medicine

## 2019-10-05 DIAGNOSIS — I10 Essential (primary) hypertension: Secondary | ICD-10-CM

## 2019-12-25 ENCOUNTER — Other Ambulatory Visit: Payer: Self-pay | Admitting: Internal Medicine

## 2019-12-25 DIAGNOSIS — I10 Essential (primary) hypertension: Secondary | ICD-10-CM

## 2019-12-26 NOTE — Telephone Encounter (Signed)
A letter has been mailed letting pt know that she was overdue for CPE 07/2019 and note to pharmacy and prescription last refill 10/2019...Marland Kitchen pt must schedule CPE for refills

## 2020-01-10 ENCOUNTER — Other Ambulatory Visit: Payer: Self-pay | Admitting: Internal Medicine

## 2020-01-10 DIAGNOSIS — I10 Essential (primary) hypertension: Secondary | ICD-10-CM

## 2020-03-12 ENCOUNTER — Other Ambulatory Visit: Payer: Self-pay | Admitting: General Surgery

## 2020-03-12 DIAGNOSIS — D0510 Intraductal carcinoma in situ of unspecified breast: Secondary | ICD-10-CM

## 2020-03-26 ENCOUNTER — Telehealth: Payer: Self-pay

## 2020-03-26 NOTE — Telephone Encounter (Signed)
Aug 9th at 4 is fine

## 2020-03-26 NOTE — Telephone Encounter (Signed)
Patient contacted the office and states she needs a CPE prior to Aug 27 for her work. Regina's CPE slots are currently booked out until the middle/end of September.  She needs a late in the day appt also. Rene Kocher, can I schedule this pt for a CPE on Aug 9 at 4:00? If not, please let me know what would be available and I will work with getting her scheduled!   Also, she needs to email a physical form to our office that needs to be filled out for during her CPE. She states she does not have a printer, and she would like [place] to print this off for her prior to her appt. I did give her the office email.  Irving Burton, would you mind keeping an eye out for this?

## 2020-03-26 NOTE — Telephone Encounter (Signed)
Appt has been scheduled. Thanks!

## 2020-03-27 NOTE — Telephone Encounter (Signed)
Received physical form. I have printed it off and placed in RX tower.

## 2020-04-03 ENCOUNTER — Other Ambulatory Visit: Payer: 59

## 2020-04-07 ENCOUNTER — Encounter: Payer: Self-pay | Admitting: Internal Medicine

## 2020-04-07 ENCOUNTER — Other Ambulatory Visit: Payer: Self-pay

## 2020-04-07 ENCOUNTER — Ambulatory Visit (INDEPENDENT_AMBULATORY_CARE_PROVIDER_SITE_OTHER): Payer: 59 | Admitting: Internal Medicine

## 2020-04-07 VITALS — BP 134/84 | HR 103 | Temp 97.4°F | Ht 66.0 in | Wt 145.0 lb

## 2020-04-07 DIAGNOSIS — I10 Essential (primary) hypertension: Secondary | ICD-10-CM

## 2020-04-07 DIAGNOSIS — Z1152 Encounter for screening for COVID-19: Secondary | ICD-10-CM

## 2020-04-07 DIAGNOSIS — Z8739 Personal history of other diseases of the musculoskeletal system and connective tissue: Secondary | ICD-10-CM | POA: Diagnosis not present

## 2020-04-07 DIAGNOSIS — E782 Mixed hyperlipidemia: Secondary | ICD-10-CM

## 2020-04-07 DIAGNOSIS — D0511 Intraductal carcinoma in situ of right breast: Secondary | ICD-10-CM

## 2020-04-07 DIAGNOSIS — Z0000 Encounter for general adult medical examination without abnormal findings: Secondary | ICD-10-CM

## 2020-04-07 NOTE — Assessment & Plan Note (Signed)
Uric acid level today Continue Indomethacin prn

## 2020-04-07 NOTE — Assessment & Plan Note (Signed)
In remission Continue Venlafaxine for menopausal symptoms

## 2020-04-07 NOTE — Patient Instructions (Signed)

## 2020-04-07 NOTE — Progress Notes (Signed)
Subjective:    Patient ID: Jane Gonzales, female    DOB: September 10, 1960, 59 y.o.   MRN: [id number]  HPI  Patient presents the clinic today for her annual exam.  She is also due to follow-up chronic conditions.  Gout: She denies recent flare.  She takes Indomethacin as needed with good relief of symptoms.  HTN: Her BP today is 134/84.  She is taking Lisinopril as prescribed.  There is no ECG on file.  History of Breast Cancer: Status post lumpectomy.  In remission.  She takes Venlafaxine for menopausal symptoms.  She continues to follow with oncology.  Flu: 04/2018 Tetanus: 03/2018 Covid: Pfizer Zostovax: 02/2015 Pap Smear: 07/2015, [facility] Mammogram: 03/2019, scheduled Bone Density: 04/2017 Colon Screening: never Vision Screening: as needed Dentist: annually  Diet: She does eat lean meat. She consumes more veggies than fruits. She tries to avoid fried foods. She drinks mostly water. Exercise: None   Review of Systems      Past Medical History:  Diagnosis Date  . 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 Chicken pox   . Kidney stones 2013  . Mammographic microcalcification 2013  . Personal history of radiation therapy 2014   BREAST CA  . Pregnancy examination or test, pregnancy unconfirmed 2014  . Shingles June 2015    Current Outpatient Medications  Medication Sig Dispense Refill  . Ascorbic Acid (VITAMIN C) 1000 MG tablet Take 1,000 mg by mouth every other day.     . Calcium Carbonate (CALCIUM 600 PO) Take by mouth.    . cholecalciferol (VITAMIN D) 1000 units tablet Take 1,000 Units by mouth daily.    . colchicine 0.6 MG tablet TAKE 1 TABLET (0.6 MG TOTAL) BY MOUTH DAILY AS NEEDED (GOUT FLARE). 90 tablet 0  . indomethacin (INDOCIN) 50 MG capsule Take 1 capsule (50 mg total) by mouth 3 (three) times daily with meals. 20 capsule 0  . lisinopril (ZESTRIL) 20 MG tablet Take 1 tablet (20 mg total) by mouth  daily. LAST REFILL MUST SCHEDULE PHYSICAL 90 tablet 0  . sodium chloride (OCEAN) 0.65 % nasal spray Place into the nose.    . venlafaxine XR (EFFEXOR-XR) 75 MG 24 hr capsule Take 1 capsule (75 mg total) by mouth daily with breakfast. LAST REFILL MUST SCHEDULE PHYSICAL 90 capsule 0   No current facility-administered medications for this visit.    No Known Allergies  Family History  Problem Relation Age of Onset  . Hyperlipidemia Father   . Heart disease Father   . Hypertension Father   . Stroke Paternal Grandmother   . Cancer Neg Hx   . Breast cancer Neg Hx     Social History   Socioeconomic History  . Marital status: Married    Spouse name: Not on file  . Number of children: Not on file  . Years of education: Not on file  . Highest education level: Not on file  Occupational History  . Not on file  Tobacco Use  . Smoking status: Current Every Day Smoker    Packs/day: 0.50    Years: 28.00    Pack years: 14.00    Types: Cigarettes  . Smokeless tobacco: Never Used  Substance and Sexual Activity  . Alcohol use: Yes    Alcohol/week: 6.0 standard drinks    Types: 6 Glasses of wine per week    Comment: beer or wine---occasional  . Drug use: No  .  Sexual activity: Yes  Other Topics Concern  . Not on file  Social History Narrative  . Not on file   Social Determinants of Health   Financial Resource Strain:   . Difficulty of Paying Living Expenses:   Food Insecurity:   . Worried About Running Out of Food in the Last Year:   . Ran Out of Food in the Last Year:   Transportation Needs:   . Lack of Transportation (Medical):   . Lack of Transportation (Non-Medical):   Physical Activity:   . Days of Exercise per Week:   . Minutes of Exercise per Session:   Stress:   . Feeling of Stress :   Social Connections:   . Frequency of Communication with Friends and Family:   . Frequency of Social Gatherings with Friends and Family:   . Attends Religious Services:   . Active  Member of Clubs or Organizations:   . Attends Club or Organization Meetings:   . Marital Status:   Intimate Partner Violence:   . Fear of Current or Ex-Partner:   . Emotionally Abused:   . Physically Abused:   . Sexually Abused:      Constitutional: Denies fever, malaise, fatigue, headache or abrupt weight changes.  HEENT: Denies eye pain, eye redness, ear pain, ringing in the ears, wax buildup, runny nose, nasal congestion, bloody nose, or sore throat. Respiratory: Denies difficulty breathing, shortness of breath, cough or sputum production.   Cardiovascular: Denies chest pain, chest tightness, palpitations or swelling in the hands or feet.  Gastrointestinal: Denies abdominal pain, bloating, constipation, diarrhea or blood in the stool.  GU: Denies urgency, frequency, pain with urination, burning sensation, blood in urine, odor or discharge. Musculoskeletal: Denies decrease in range of motion, difficulty with gait, muscle pain or joint pain and swelling.  Skin: Denies redness, rashes, lesions or ulcercations.  Neurological: Denies dizziness, difficulty with memory, difficulty with speech or problems with balance and coordination.  Psych: Denies anxiety, depression, SI/HI.  No other specific complaints in a complete review of systems (except as listed in HPI above).  Objective:   Physical Exam  BP 134/84   Pulse (!) 103   Temp (!) 97.4 F (36.3 C) (Temporal)   Ht 5' 6" (1.676 m)   Wt 145 lb (65.8 kg)   LMP 05/20/2015   SpO2 98%   BMI 23.40 kg/m   Wt Readings from Last 3 Encounters:  08/17/18 135 lb (61.2 kg)  04/03/18 141 lb (64 kg)  12/22/17 142 lb 12 oz (64.8 kg)    General: Appears her stated age, well developed, well nourished in NAD. Skin: Warm, dry and intact. No rashesnoted. HEENT: Head: normal shape and size; Eyes: sclera white, no icterus, conjunctiva pink, PERRLA and EOMs intact;  Neck:  Neck supple, trachea midline. No masses, lumps or thyromegaly present.    Cardiovascular: Normal rate and rhythm. S1,S2 noted.  No murmur, rubs or gallops noted. No JVD or BLE edema. No carotid bruits noted. Pulmonary/Chest: Normal effort and positive vesicular breath sounds. No respiratory distress. No wheezes, rales or ronchi noted.  Abdomen: Soft and nontender. Normal bowel sounds. No distention or masses noted. Liver, spleen and kidneys non palpable. Musculoskeletal: Strength 5/5 BUE/BLE. No difficulty with gait.  Neurological: Alert and oriented. Cranial nerves II-XII grossly intact. Coordination normal.  Psychiatric: Mood and affect normal. Behavior is normal. Judgment and thought content normal.   EKG:  BMET    Component Value Date/Time   NA 139 04/03/2018 1504     K 4.4 04/03/2018 1504   CL 101 04/03/2018 1504   CO2 24 04/03/2018 1504   GLUCOSE 103 (H) 04/03/2018 1504   GLUCOSE 104 (H) 03/14/2017 1610   BUN 17 04/03/2018 1504   CREATININE 0.83 04/03/2018 1504   CALCIUM 10.2 04/03/2018 1504   GFRNONAA 80 04/03/2018 1504   GFRAA 92 04/03/2018 1504    Lipid Panel     Component Value Date/Time   CHOL 154 04/03/2018 1504   TRIG 225 (H) 04/03/2018 1504   HDL 49 04/03/2018 1504   CHOLHDL 3.1 04/03/2018 1504   LDLCALC 60 04/03/2018 1504    CBC    Component Value Date/Time   WBC 7.9 04/03/2018 1504   WBC 7.4 03/14/2017 1610   RBC 4.36 04/03/2018 1504   RBC 3.98 03/14/2017 1610   HGB 14.4 04/03/2018 1504   HCT 42.0 04/03/2018 1504   PLT 308 04/03/2018 1504   MCV 96 04/03/2018 1504   MCV 100 12/07/2012 0814   MCH 33.0 04/03/2018 1504   MCH 33.4 12/07/2012 0814   MCHC 34.3 04/03/2018 1504   MCHC 34.3 03/14/2017 1610   RDW 14.2 04/03/2018 1504   RDW 12.5 12/07/2012 0814   LYMPHSABS 1.6 12/22/2017 0953   LYMPHSABS 0.7 (L) 12/07/2012 0814   MONOABS 0.9 03/14/2017 1610   MONOABS 0.7 12/07/2012 0814   EOSABS 0.0 12/22/2017 0953   EOSABS 0.0 12/07/2012 0814   BASOSABS 0.0 12/22/2017 0953   BASOSABS 0.0 12/07/2012 0814    Hgb A1C Lab  Results  Component Value Date   HGBA1C 5.3 04/03/2018           Assessment & Plan:   Preventative Health Maintenance:  Encouraged her to get a flu shot in the fall Tetanus UTD Covid UTD Zostavax UTD Mammogram scheduled Pap smear scheduled Bone density UTD She declines colon cancer screening Encouraged her to consume a balanced diet and exercise regimen Advised her to see an eye doctor and dentist annually We will check CBC, C met, lipid, A1C and vitamin D today  RTC in 1 year, sooner if needed Regina Baity, NP  This visit occurred during the SARS-CoV-2 public health emergency.  Safety protocols were in place, including screening questions prior to the visit, additional usage of staff PPE, and extensive cleaning of exam room while observing appropriate contact time as indicated for disinfecting solutions.    

## 2020-04-07 NOTE — Assessment & Plan Note (Signed)
Controlled on Lisinopril CMET today Will monitor 

## 2020-04-08 LAB — CBC
Hematocrit: 45 % (ref 34.0–46.6)
Hemoglobin: 16.6 g/dL — ABNORMAL HIGH (ref 11.1–15.9)
MCH: 36.1 pg — ABNORMAL HIGH (ref 26.6–33.0)
MCHC: 36.9 g/dL — ABNORMAL HIGH (ref 31.5–35.7)
MCV: 98 fL — ABNORMAL HIGH (ref 79–97)
Platelets: 326 10*3/uL (ref 150–450)
RBC: 4.6 x10E6/uL (ref 3.77–5.28)
RDW: 12.6 % (ref 11.7–15.4)
WBC: 9 10*3/uL (ref 3.4–10.8)

## 2020-04-08 LAB — COMPREHENSIVE METABOLIC PANEL
ALT: 18 IU/L (ref 0–32)
AST: 22 IU/L (ref 0–40)
Albumin/Globulin Ratio: 1.7 (ref 1.2–2.2)
Albumin: 4.6 g/dL (ref 3.8–4.9)
Alkaline Phosphatase: 120 IU/L (ref 48–121)
BUN/Creatinine Ratio: 21 (ref 9–23)
BUN: 16 mg/dL (ref 6–24)
Bilirubin Total: 0.6 mg/dL (ref 0.0–1.2)
CO2: 23 mmol/L (ref 20–29)
Calcium: 10.5 mg/dL — ABNORMAL HIGH (ref 8.7–10.2)
Chloride: 100 mmol/L (ref 96–106)
Creatinine, Ser: 0.78 mg/dL (ref 0.57–1.00)
GFR calc Af Amer: 98 mL/min/{1.73_m2} (ref >59)
GFR calc non Af Amer: 85 mL/min/{1.73_m2} (ref >59)
Globulin, Total: 2.7 g/dL (ref 1.5–4.5)
Glucose: 101 mg/dL — ABNORMAL HIGH (ref 65–99)
Potassium: 4.3 mmol/L (ref 3.5–5.2)
Sodium: 138 mmol/L (ref 134–144)
Total Protein: 7.3 g/dL (ref 6.0–8.5)

## 2020-04-08 LAB — LIPID PANEL
Chol/HDL Ratio: 4.4 ratio (ref 0.0–4.4)
Cholesterol, Total: 244 mg/dL — ABNORMAL HIGH (ref 100–199)
HDL: 55 mg/dL (ref >39)
LDL Chol Calc (NIH): 147 mg/dL — ABNORMAL HIGH (ref 0–99)
Triglycerides: 233 mg/dL — ABNORMAL HIGH (ref 0–149)
VLDL Cholesterol Cal: 42 mg/dL — ABNORMAL HIGH (ref 5–40)

## 2020-04-08 LAB — HEMOGLOBIN A1C
Est. average glucose Bld gHb Est-mCnc: 105 mg/dL
Hgb A1c MFr Bld: 5.3 % (ref 4.8–5.6)

## 2020-04-10 ENCOUNTER — Telehealth: Payer: Self-pay | Admitting: Internal Medicine

## 2020-04-10 ENCOUNTER — Other Ambulatory Visit: Payer: Self-pay

## 2020-04-10 ENCOUNTER — Ambulatory Visit
Admission: RE | Admit: 2020-04-10 | Discharge: 2020-04-10 | Disposition: A | Discharge status: Home / Self Care | Payer: 59 | Source: Ambulatory Visit | Attending: General Surgery | Admitting: General Surgery

## 2020-04-10 DIAGNOSIS — Z1231 Encounter for screening mammogram for malignant neoplasm of breast: Secondary | ICD-10-CM | POA: Diagnosis not present

## 2020-04-10 DIAGNOSIS — D0510 Intraductal carcinoma in situ of unspecified breast: Secondary | ICD-10-CM

## 2020-04-10 NOTE — Telephone Encounter (Signed)
opend error

## 2020-04-10 NOTE — Telephone Encounter (Signed)
I already have the forms

## 2020-04-10 NOTE — Telephone Encounter (Signed)
Pt emailed wellness screening form  On cart to be delivered to [employer]

## 2020-04-15 ENCOUNTER — Encounter: Payer: Self-pay | Admitting: Internal Medicine

## 2020-04-16 LAB — SPECIMEN STATUS REPORT

## 2020-04-16 LAB — URIC ACID: Uric Acid: 5.4 mg/dL (ref 3.0–7.2)

## 2020-04-16 LAB — SARS-COV-2 SEMI-QUANTITATIVE TOTAL ANTIBODY, SPIKE
SARS-CoV-2 Semi-Quant Total Ab: 325.5 U/mL (ref <0.8)
SARS-CoV-2 Spike Ab Interp: POSITIVE

## 2020-04-16 LAB — VITAMIN D 25 HYDROXY (VIT D DEFICIENCY, FRACTURES): Vit D, 25-Hydroxy: 17.4 ng/mL — ABNORMAL LOW (ref 30.0–100.0)

## 2020-05-14 MED ORDER — VITAMIN D (ERGOCALCIFEROL) 1.25 MG (50000 UNIT) PO CAPS: 50000.0000 [IU] | ORAL_CAPSULE | ORAL | 0 refills | Status: DC

## 2020-05-14 NOTE — Addendum Note (Signed)
Addended by: Roena Malady on: 05/14/2020 12:36 PM   Modules accepted: Orders

## 2020-05-14 NOTE — Addendum Note (Signed)
Addended by: Roena Malady on: 05/14/2020 12:38 PM   Modules accepted: Orders

## 2021-04-09 ENCOUNTER — Encounter: Payer: Self-pay | Admitting: Internal Medicine

## 2021-04-13 ENCOUNTER — Encounter: Payer: Self-pay | Admitting: Internal Medicine

## 2021-04-13 ENCOUNTER — Ambulatory Visit (INDEPENDENT_AMBULATORY_CARE_PROVIDER_SITE_OTHER): Payer: 59 | Admitting: Internal Medicine

## 2021-04-13 ENCOUNTER — Other Ambulatory Visit: Payer: Self-pay

## 2021-04-13 VITALS — BP 158/86 | HR 101 | Temp 97.8°F | Resp 17 | Ht 66.0 in | Wt 150.0 lb

## 2021-04-13 DIAGNOSIS — I10 Essential (primary) hypertension: Secondary | ICD-10-CM

## 2021-04-13 DIAGNOSIS — Z0001 Encounter for general adult medical examination with abnormal findings: Secondary | ICD-10-CM | POA: Diagnosis not present

## 2021-04-13 DIAGNOSIS — M1000 Idiopathic gout, unspecified site: Secondary | ICD-10-CM

## 2021-04-13 DIAGNOSIS — Z853 Personal history of malignant neoplasm of breast: Secondary | ICD-10-CM

## 2021-04-13 DIAGNOSIS — Z124 Encounter for screening for malignant neoplasm of cervix: Secondary | ICD-10-CM | POA: Diagnosis not present

## 2021-04-13 DIAGNOSIS — E785 Hyperlipidemia, unspecified: Secondary | ICD-10-CM | POA: Insufficient documentation

## 2021-04-13 DIAGNOSIS — K219 Gastro-esophageal reflux disease without esophagitis: Secondary | ICD-10-CM | POA: Insufficient documentation

## 2021-04-13 DIAGNOSIS — Z1231 Encounter for screening mammogram for malignant neoplasm of breast: Secondary | ICD-10-CM

## 2021-04-13 DIAGNOSIS — E782 Mixed hyperlipidemia: Secondary | ICD-10-CM

## 2021-04-13 MED ORDER — LISINOPRIL 20 MG PO TABS: 20.0000 mg | ORAL_TABLET | Freq: Every day | ORAL | 0 refills | Status: DC

## 2021-04-13 NOTE — Assessment & Plan Note (Signed)
In remission She will continue to get yearly mammograms

## 2021-04-13 NOTE — Assessment & Plan Note (Signed)
Avoid eating late at night Okay to continue Tums OTC as needed

## 2021-04-13 NOTE — Assessment & Plan Note (Signed)
Uncontrolled off meds Rx for Lisinopril 20 mg sent to pharmacy Reinforced DASH diet and exercise weight loss C-Met today 

## 2021-04-13 NOTE — Patient Instructions (Signed)

## 2021-04-13 NOTE — Progress Notes (Signed)
Subjective:    Patient ID: Jane Gonzales, female    DOB: 01-Jun-1961, 60 y.o.   MRN: [id number]  HPI  Patient presents the clinic today for her annual exam.  She is also due to follow-up chronic conditions.  HTN: Her BP today is 164/99.  She is not been taking Lisinopril as prescribed because she ran out.  There is no ECG on file.  History of Breast Cancer: Status post lumpectomy, and radiation.  She is getting yearly mammograms.  Menopausal Symptoms: Improved now that she is off Tamoxifen. She no longer taking Venlafaxine.  Gout: No recent flares, not on Colchicine.  She does not take Indomethacin.  HLD: Her last LDL was 147, triglycerides 233, 03/2020.  She is not taking any cholesterol-lowering medication at this time.  She tries to consume a low-fat diet.  GERD: Triggered by eating late at night.  She takes Tums OTC as needed with some relief of symptoms.  There is no upper GI on file.  Flu: 05/2019 Tetanus: 03/2018 COVID: Pfizer x4 Zostavax: 02/2015 Shingrix: Never Pap smear: > 5 years ago Mammogram: 03/2020 Bone density: 04/2017 Colon screening: never Vision screening: annually Dentist: biannually  Diet: She does eat meat. She consumes more veggies than fruits. She tries to avoid fried foods. She drinks mostly water. 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 SN-neg,ER/PR positive   Chicken pox    Kidney stones 2013   Mammographic microcalcification 2013   Personal history of radiation therapy 2014   BREAST CA   Pregnancy examination or test, pregnancy unconfirmed 2014   Shingles June 2015    Current Outpatient Medications  Medication Sig Dispense Refill   Ascorbic Acid (VITAMIN C) 1000 MG tablet Take 1,000 mg by mouth every other day.      Calcium Carbonate (CALCIUM 600 PO) Take by mouth.     cholecalciferol (VITAMIN D) 1000 units tablet Take  1,000 Units by mouth daily.     colchicine 0.6 MG tablet TAKE 1 TABLET (0.6 MG TOTAL) BY MOUTH DAILY AS NEEDED (GOUT FLARE). 90 tablet 0   indomethacin (INDOCIN) 50 MG capsule Take 1 capsule (50 mg total) by mouth 3 (three) times daily with meals. 20 capsule 0   lisinopril (ZESTRIL) 20 MG tablet Take 1 tablet (20 mg total) by mouth daily. LAST REFILL MUST SCHEDULE PHYSICAL 90 tablet 0   sodium chloride (OCEAN) 0.65 % nasal spray Place into the nose.     venlafaxine XR (EFFEXOR-XR) 75 MG 24 hr capsule Take 1 capsule (75 mg total) by mouth daily with breakfast. LAST REFILL MUST SCHEDULE PHYSICAL 90 capsule 0   Vitamin D, Ergocalciferol, (DRISDOL) 1.25 MG (50000 UNIT) CAPS capsule Take 1 capsule (50,000 Units total) by mouth every 7 (seven) days. 12 capsule 0   No current facility-administered medications for this visit.    No Known Allergies  Family History  Problem Relation Age of Onset   Hyperlipidemia Father    Heart disease Father    Hypertension Father    Stroke Paternal Grandmother    Cancer Neg Hx    Breast cancer Neg Hx     Social History   Socioeconomic History   Marital status: Married    Spouse name: Not on file   Number of children: Not on file   Years of education: Not on file  Highest education level: Not on file  Occupational History   Not on file  Tobacco Use   Smoking status: Every Day    Packs/day: 0.50    Years: 28.00    Pack years: 14.00    Types: Cigarettes   Smokeless tobacco: Never  Substance and Sexual Activity   Alcohol use: Yes    Alcohol/week: 6.0 standard drinks    Types: 6 Glasses of wine per week    Comment: beer or wine---occasional   Drug use: No   Sexual activity: Yes  Other Topics Concern   Not on file  Social History Narrative   Not on file   Social Determinants of Health   Financial Resource Strain: Not on file  Food Insecurity: Not on file  Transportation Needs: Not on file  Physical Activity: Not on file  Stress: Not on  file  Social Connections: Not on file  Intimate Partner Violence: Not on file     Constitutional: Denies fever, malaise, fatigue, headache or abrupt weight changes.  HEENT: Denies eye pain, eye redness, ear pain, ringing in the ears, wax buildup, runny nose, nasal congestion, bloody nose, or sore throat. Respiratory: Denies difficulty breathing, shortness of breath, cough or sputum production.   Cardiovascular: Denies chest pain, chest tightness, palpitations or swelling in the hands or feet.  Gastrointestinal: Patient reports intermittent reflux.  Denies abdominal pain, bloating, constipation, diarrhea or blood in the stool.  GU: Denies urgency, frequency, pain with urination, burning sensation, blood in urine, odor or discharge. Musculoskeletal: Denies decrease in range of motion, difficulty with gait, muscle pain or joint pain and swelling.  Skin: Denies redness, rashes, lesions or ulcercations.  Neurological: Denies dizziness, difficulty with memory, difficulty with speech or problems with balance and coordination.  Psych: Denies anxiety, depression, SI/HI.  No other specific complaints in a complete review of systems (except as listed in HPI above).  Objective:   Physical Exam BP (!) 164/99 (BP Location: Right Arm, Patient Position: Sitting, Cuff Size: Small)   Pulse (!) 101   Temp 97.8 F (36.6 C) (Temporal)   Resp 17   Ht 5' 6" (1.676 m)   Wt 150 lb (68 kg)   LMP 05/20/2015   SpO2 98%   BMI 24.21 kg/m   Wt Readings from Last 3 Encounters:  04/07/20 145 lb (65.8 kg)  08/17/18 135 lb (61.2 kg)  04/03/18 141 lb (64 kg)    General: Appears her stated age, well developed, well nourished in NAD. Skin: Warm, dry and intact. No rashes noted. HEENT: Head: normal shape and size; Eyes: sclera white and EOMs intact;  Neck:  Neck supple, trachea midline. No masses, lumps or thyromegaly present.  Cardiovascular: Normal rate and rhythm. S1,S2 noted.  No murmur, rubs or gallops  noted. No JVD or BLE edema. No carotid bruits noted. Pulmonary/Chest: Normal effort and positive vesicular breath sounds. No respiratory distress. No wheezes, rales or ronchi noted.  Abdomen: Soft and nontender. Normal bowel sounds. No distention or masses noted. Liver, spleen and kidneys non palpable. Pelvic: Normal female anatomy.  Cervix without mass or lesion noted.  No CMT.  Adnexa nonpalpable. Musculoskeletal: Strength 5/5 BUE/BLE.  No difficulty with gait.  Neurological: Alert and oriented. Cranial nerves II-XII grossly intact. Coordination normal.  Psychiatric: Mood and affect normal. Behavior is normal. Judgment and thought content normal.     BMET    Component Value Date/Time   NA 138 04/07/2020 1619   K 4.3 04/07/2020 1619   CL  100 04/07/2020 1619   CO2 23 04/07/2020 1619   GLUCOSE 101 (H) 04/07/2020 1619   GLUCOSE 104 (H) 03/14/2017 1610   BUN 16 04/07/2020 1619   CREATININE 0.78 04/07/2020 1619   CALCIUM 10.5 (H) 04/07/2020 1619   GFRNONAA 85 04/07/2020 1619   GFRAA 98 04/07/2020 1619    Lipid Panel     Component Value Date/Time   CHOL 244 (H) 04/07/2020 1619   TRIG 233 (H) 04/07/2020 1619   HDL 55 04/07/2020 1619   CHOLHDL 4.4 04/07/2020 1619   LDLCALC 147 (H) 04/07/2020 1619    CBC    Component Value Date/Time   WBC 9.0 04/07/2020 1619   WBC 7.4 03/14/2017 1610   RBC 4.60 04/07/2020 1619   RBC 3.98 03/14/2017 1610   HGB 16.6 (H) 04/07/2020 1619   HCT 45.0 04/07/2020 1619   PLT 326 04/07/2020 1619   MCV 98 (H) 04/07/2020 1619   MCV 100 12/07/2012 0814   MCH 36.1 (H) 04/07/2020 1619   MCH 33.4 12/07/2012 0814   MCHC 36.9 (H) 04/07/2020 1619   MCHC 34.3 03/14/2017 1610   RDW 12.6 04/07/2020 1619   RDW 12.5 12/07/2012 0814   LYMPHSABS 1.6 12/22/2017 0953   LYMPHSABS 0.7 (L) 12/07/2012 0814   MONOABS 0.9 03/14/2017 1610   MONOABS 0.7 12/07/2012 0814   EOSABS 0.0 12/22/2017 0953   EOSABS 0.0 12/07/2012 0814   BASOSABS 0.0 12/22/2017 0953    BASOSABS 0.0 12/07/2012 0814    Hgb A1C Lab Results  Component Value Date   HGBA1C 5.3 04/07/2020            Assessment & Plan:   Preventative Health Maintenance:  Encouraged her to get a flu shot in the fall Tetanus UTD Discussed Shingrix vaccine, she will hold off at this time Zostavax UTD Encouraged her to attach a copy of her COVID vaccination card to my chart for abstraction Pap smear today, she declines STD screening She is due for her mammogram, she will call to get this scheduled Bone density due 2023 She declines colon screening Encouraged her to consume a balanced diet and exercise regimen Advised her to see an eye doctor and dentist annually We will check CBC, c-Met, lipid today  RTC in 1 year, sooner if needed Webb Silversmith, NP This visit occurred during the SARS-CoV-2 public health emergency.  Safety protocols were in place, including screening questions prior to the visit, additional usage of staff PPE, and extensive cleaning of exam room while observing appropriate contact time as indicated for disinfecting solutions.

## 2021-04-13 NOTE — Assessment & Plan Note (Signed)
No issues off meds °Will monitor °

## 2021-04-13 NOTE — Assessment & Plan Note (Signed)
Encouraged her to consume a low-fat diet C-Met and lipid profile today

## 2021-04-14 LAB — COMPREHENSIVE METABOLIC PANEL
ALT: 23 IU/L (ref 0–32)
AST: 21 IU/L (ref 0–40)
Albumin/Globulin Ratio: 2 (ref 1.2–2.2)
Albumin: 4.6 g/dL (ref 3.8–4.9)
Alkaline Phosphatase: 116 IU/L (ref 44–121)
BUN/Creatinine Ratio: 10 (ref 9–23)
BUN: 9 mg/dL (ref 6–24)
Bilirubin Total: 0.6 mg/dL (ref 0.0–1.2)
CO2: 25 mmol/L (ref 20–29)
Calcium: 10.7 mg/dL — ABNORMAL HIGH (ref 8.7–10.2)
Chloride: 103 mmol/L (ref 96–106)
Creatinine, Ser: 0.86 mg/dL (ref 0.57–1.00)
Globulin, Total: 2.3 g/dL (ref 1.5–4.5)
Glucose: 105 mg/dL — ABNORMAL HIGH (ref 65–99)
Potassium: 4.1 mmol/L (ref 3.5–5.2)
Sodium: 144 mmol/L (ref 134–144)
Total Protein: 6.9 g/dL (ref 6.0–8.5)
eGFR: 78 mL/min/{1.73_m2} (ref >59)

## 2021-04-14 LAB — CBC
Hematocrit: 50.4 % — ABNORMAL HIGH (ref 34.0–46.6)
Hemoglobin: 17.7 g/dL — ABNORMAL HIGH (ref 11.1–15.9)
MCH: 34 pg — ABNORMAL HIGH (ref 26.6–33.0)
MCHC: 35.1 g/dL (ref 31.5–35.7)
MCV: 97 fL (ref 79–97)
Platelets: 327 10*3/uL (ref 150–450)
RBC: 5.2 x10E6/uL (ref 3.77–5.28)
RDW: 13.1 % (ref 11.7–15.4)
WBC: 8.1 10*3/uL (ref 3.4–10.8)

## 2021-04-14 LAB — LIPID PANEL
Chol/HDL Ratio: 4.8 ratio — ABNORMAL HIGH (ref 0.0–4.4)
Cholesterol, Total: 224 mg/dL — ABNORMAL HIGH (ref 100–199)
HDL: 47 mg/dL (ref >39)
LDL Chol Calc (NIH): 140 mg/dL — ABNORMAL HIGH (ref 0–99)
Triglycerides: 203 mg/dL — ABNORMAL HIGH (ref 0–149)
VLDL Cholesterol Cal: 37 mg/dL (ref 5–40)

## 2021-04-17 LAB — PAP LB (LIQUID-BASED)

## 2021-05-12 ENCOUNTER — Ambulatory Visit
Admission: RE | Admit: 2021-05-12 | Discharge: 2021-05-12 | Disposition: A | Discharge status: Home / Self Care | Payer: 59 | Source: Ambulatory Visit | Attending: Internal Medicine | Admitting: Internal Medicine

## 2021-05-12 ENCOUNTER — Other Ambulatory Visit: Payer: Self-pay

## 2021-05-12 DIAGNOSIS — Z1231 Encounter for screening mammogram for malignant neoplasm of breast: Secondary | ICD-10-CM | POA: Diagnosis present

## 2021-06-07 ENCOUNTER — Other Ambulatory Visit: Payer: Self-pay | Admitting: Internal Medicine

## 2021-06-07 DIAGNOSIS — I10 Essential (primary) hypertension: Secondary | ICD-10-CM

## 2021-06-08 NOTE — Telephone Encounter (Signed)
Requested Prescriptions  Pending Prescriptions Disp Refills  . lisinopril (ZESTRIL) 20 MG tablet [Pharmacy Med Name: Lisinopril 20 MG Oral Tablet] 90 tablet 1    Sig: TAKE 1 TABLET BY MOUTH  DAILY     Cardiovascular:  ACE Inhibitors Failed - 06/07/2021 10:36 PM      Failed - Last BP in normal range    BP Readings from Last 1 Encounters:  04/13/21 (!) 158/86         Passed - Cr in normal range and within 180 days    Creatinine, Ser  Date Value Ref Range Status  04/13/2021 0.86 0.57 - 1.00 mg/dL Final         Passed - K in normal range and within 180 days    Potassium  Date Value Ref Range Status  04/13/2021 4.1 3.5 - 5.2 mmol/L Final         Passed - Patient is not pregnant      Passed - Valid encounter within last 6 months    Recent Outpatient Visits          1 month ago Encounter for general adult medical examination with abnormal findings   [facility] [place], Salvadore Oxford, NP

## 2021-12-02 ENCOUNTER — Other Ambulatory Visit: Payer: Self-pay | Admitting: Internal Medicine

## 2021-12-02 DIAGNOSIS — I10 Essential (primary) hypertension: Secondary | ICD-10-CM

## 2021-12-03 NOTE — Telephone Encounter (Signed)
Requested Prescriptions  ?Pending Prescriptions Disp Refills  ?? lisinopril (ZESTRIL) 20 MG tablet [Pharmacy Med Name: Lisinopril 20 MG Oral Tablet] 90 tablet 0  ?  Sig: TAKE 1 TABLET BY MOUTH  DAILY  ?  ? Cardiovascular:  ACE Inhibitors Failed - 12/02/2021 11:24 PM  ?  ?  Failed - Cr in normal range and within 180 days  ?  Creatinine, Ser  ?Date Value Ref Range Status  ?04/13/2021 0.86 0.57 - 1.00 mg/dL Final  ?   ?  ?  Failed - K in normal range and within 180 days  ?  Potassium  ?Date Value Ref Range Status  ?04/13/2021 4.1 3.5 - 5.2 mmol/L Final  ?   ?  ?  Failed - Last BP in normal range  ?  BP Readings from Last 1 Encounters:  ?04/13/21 (!) 158/86  ?   ?  ?  Failed - Valid encounter within last 6 months  ?  Recent Outpatient Visits   ?      ? 7 months ago Encounter for general adult medical examination with abnormal findings  ? [facility] [place], Salvadore Oxford, NP  ?  ?  ? ?  ?  ?  Passed - Patient is not pregnant  ?  ?  ? ? ?

## 2022-02-23 ENCOUNTER — Other Ambulatory Visit: Payer: Self-pay | Admitting: Internal Medicine

## 2022-02-23 DIAGNOSIS — I10 Essential (primary) hypertension: Secondary | ICD-10-CM

## 2022-04-14 ENCOUNTER — Encounter: Payer: 59 | Admitting: Internal Medicine

## 2022-05-19 ENCOUNTER — Telehealth: Payer: Self-pay

## 2022-05-19 ENCOUNTER — Encounter: Payer: Self-pay | Admitting: Internal Medicine

## 2022-05-19 ENCOUNTER — Other Ambulatory Visit: Payer: Self-pay | Admitting: Internal Medicine

## 2022-05-19 DIAGNOSIS — I10 Essential (primary) hypertension: Secondary | ICD-10-CM

## 2022-05-19 NOTE — Telephone Encounter (Signed)
Pt called back. She stated she had recent lab work at [facility] and is having trouble getting the information to [employer]. Pt stated she doesn't have a printer or scanner and that she doesn't have the information on her phone. Pt stated she will keep trying. Pt has an upcoming CPE that we scheduled and wants to get the lab results to PCP. Advised to call back if can be of any help to her.

## 2022-05-19 NOTE — Telephone Encounter (Signed)
Requested medication (s) are due for refill today: yes  Requested medication (s) are on the active medication list: yes  Last refill:  02/23/22 #90  Future visit scheduled: yes  Notes to clinic:  overdue lab work- pt stated that she has had lab work done at [facility] where she does not have to pay but is having trouble getting the document sent via [place]. Please see TE>   Requested Prescriptions  Pending Prescriptions Disp Refills   lisinopril (ZESTRIL) 20 MG tablet [Pharmacy Med Name: Lisinopril 20 MG Oral Tablet] 90 tablet 3    Sig: TAKE 1 TABLET BY MOUTH DAILY     Cardiovascular:  ACE Inhibitors Failed - 05/19/2022  7:01 AM      Failed - Cr in normal range and within 180 days    Creatinine, Ser  Date Value Ref Range Status  04/13/2021 0.86 0.57 - 1.00 mg/dL Final         Failed - K in normal range and within 180 days    Potassium  Date Value Ref Range Status  04/13/2021 4.1 3.5 - 5.2 mmol/L Final         Failed - Last BP in normal range    BP Readings from Last 1 Encounters:  04/13/21 (!) 158/86         Failed - Valid encounter within last 6 months    Recent Outpatient Visits           1 year ago Encounter for general adult medical examination with abnormal findings   [place], NP       Future Appointments             In 3 weeks Garnette Gunner, Coralie Keens, NP [facility], [place] - Patient is not pregnant

## 2022-06-15 ENCOUNTER — Ambulatory Visit (INDEPENDENT_AMBULATORY_CARE_PROVIDER_SITE_OTHER): Payer: 59 | Admitting: Internal Medicine

## 2022-06-15 ENCOUNTER — Encounter: Payer: Self-pay | Admitting: Internal Medicine

## 2022-06-15 VITALS — BP 126/84 | HR 103 | Temp 96.6°F | Ht 66.5 in | Wt 151.0 lb

## 2022-06-15 DIAGNOSIS — Z780 Asymptomatic menopausal state: Secondary | ICD-10-CM

## 2022-06-15 DIAGNOSIS — Z1231 Encounter for screening mammogram for malignant neoplasm of breast: Secondary | ICD-10-CM

## 2022-06-15 DIAGNOSIS — Z0001 Encounter for general adult medical examination with abnormal findings: Secondary | ICD-10-CM | POA: Diagnosis not present

## 2022-06-15 NOTE — Progress Notes (Signed)
Subjective:    Patient ID: Jane Gonzales, female    DOB: [birth year], 61 y.o.   MRN: [id number]  HPI  Patient presents to clinic today for her annual exam.  Flu: 05/2022 Tetanus: 03/2018 COVID: Pfizer x2 Zostavax: 02/2015 Shingrix: Never Pap smear: 03/2021 Mammogram: 04/2021 Bone density: 04/2017 Colon screening: never Vision screening: as needed Dentist: biannually  Diet: She does eat some meat. She consumes fruits and veggies. She tries to avoid fried foods. She drinks mostly water.  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 SN-neg,ER/PR positive   Chicken pox    Kidney stones 2013   Mammographic microcalcification 2013   Personal history of radiation therapy 2014   BREAST CA   Pregnancy examination or test, pregnancy unconfirmed 2014   Shingles June 2015    Current Outpatient Medications  Medication Sig Dispense Refill   Ascorbic Acid (VITAMIN C) 1000 MG tablet Take 1,000 mg by mouth every other day.      Calcium Carbonate (CALCIUM 600 PO) Take by mouth.     cholecalciferol (VITAMIN D) 1000 units tablet Take 1,000 Units by mouth daily.     lisinopril (ZESTRIL) 20 MG tablet TAKE 1 TABLET BY MOUTH DAILY 30 tablet 0   sodium chloride (OCEAN) 0.65 % nasal spray Place into the nose.     No current facility-administered medications for this visit.    No Known Allergies  Family History  Problem Relation Age of Onset   Hyperlipidemia Father    Heart disease Father    Hypertension Father    Stroke Paternal Grandmother    Cancer Neg Hx    Breast cancer Neg Hx     Social History   Socioeconomic History   Marital status: Married    Spouse name: Not on file   Number of children: Not on file   Years of education: Not on file   Highest education level: Not on file  Occupational History   Not on file  Tobacco Use   Smoking status: Every Day     Packs/day: 0.50    Years: 28.00    Total pack years: 14.00    Types: Cigarettes   Smokeless tobacco: Never  Substance and Sexual Activity   Alcohol use: Yes    Alcohol/week: 6.0 standard drinks of alcohol    Types: 6 Glasses of wine per week    Comment: beer or wine---occasional   Drug use: No   Sexual activity: Yes  Other Topics Concern   Not on file  Social History Narrative   Not on file   Social Determinants of Health   Financial Resource Strain: Not on file  Food Insecurity: Not on file  Transportation Needs: Not on file  Physical Activity: Not on file  Stress: Not on file  Social Connections: Not on file  Intimate Partner Violence: Not on file     Constitutional: Denies fever, malaise, fatigue, headache or abrupt weight changes.  HEENT: Denies eye pain, eye redness, ear pain, ringing in the ears, wax buildup, runny nose, nasal congestion, bloody nose, or sore throat. Respiratory: Denies difficulty breathing, shortness of breath, cough or sputum production.   Cardiovascular: Denies chest pain, chest tightness, palpitations or swelling in the hands or feet.  Gastrointestinal: Denies abdominal pain, bloating, constipation, diarrhea or blood in the stool.  GU: Denies urgency, frequency,  pain with urination, burning sensation, blood in urine, odor or discharge. Musculoskeletal: Denies decrease in range of motion, difficulty with gait, muscle pain or joint pain and swelling.  Skin: Denies redness, rashes, lesions or ulcercations.  Neurological: Denies dizziness, difficulty with memory, difficulty with speech or problems with balance and coordination.  Psych: Denies anxiety, depression, SI/HI.  No other specific complaints in a complete review of systems (except as listed in HPI above).  Objective:   Physical Exam  BP 126/84 (BP Location: Left Arm, Patient Position: Sitting, Cuff Size: Normal)   Pulse (!) 103   Temp (!) 96.6 F (35.9 C) (Temporal)   Ht 5' 6.5"  (1.689 m)   Wt 151 lb (68.5 kg)   LMP 05/20/2015   SpO2 99%   BMI 24.01 kg/m   Wt Readings from Last 3 Encounters:  04/13/21 150 lb (68 kg)  04/07/20 145 lb (65.8 kg)  08/17/18 135 lb (61.2 kg)    General: Appears her stated age, well developed, well nourished in NAD. Skin: Warm, dry and intact.  HEENT: Head: normal shape and size; Eyes: sclera white, no icterus, conjunctiva pink, PERRLA and EOMs intact;  Neck:  Neck supple, trachea midline. No masses, lumps or thyromegaly present.  Cardiovascular: Tachycardic with normal rhythm. S1,S2 noted.  No murmur, rubs or gallops noted. No JVD or BLE edema. No carotid bruits noted. Pulmonary/Chest: Normal effort and positive vesicular breath sounds. No respiratory distress. No wheezes, rales or ronchi noted.  Abdomen: Normal bowel sounds. Musculoskeletal: Strength 5/5 BUE/BLE. No difficulty with gait.  Neurological: Alert and oriented. Cranial nerves II-XII grossly intact. Coordination normal.  Psychiatric: Mood and affect normal. Behavior is normal. Judgment and thought content normal.    BMET    Component Value Date/Time   NA 144 04/13/2021 1542   K 4.1 04/13/2021 1542   CL 103 04/13/2021 1542   CO2 25 04/13/2021 1542   GLUCOSE 105 (H) 04/13/2021 1542   GLUCOSE 104 (H) 03/14/2017 1610   BUN 9 04/13/2021 1542   CREATININE 0.86 04/13/2021 1542   CALCIUM 10.7 (H) 04/13/2021 1542   GFRNONAA 85 04/07/2020 1619   GFRAA 98 04/07/2020 1619    Lipid Panel     Component Value Date/Time   CHOL 224 (H) 04/13/2021 1542   TRIG 203 (H) 04/13/2021 1542   HDL 47 04/13/2021 1542   CHOLHDL 4.8 (H) 04/13/2021 1542   LDLCALC 140 (H) 04/13/2021 1542    CBC    Component Value Date/Time   WBC 8.1 04/13/2021 1542   WBC 7.4 03/14/2017 1610   RBC 5.20 04/13/2021 1542   RBC 3.98 03/14/2017 1610   HGB 17.7 (H) 04/13/2021 1542   HCT 50.4 (H) 04/13/2021 1542   PLT 327 04/13/2021 1542   MCV 97 04/13/2021 1542   MCV 100 12/07/2012 0814   MCH  34.0 (H) 04/13/2021 1542   MCH 33.4 12/07/2012 0814   MCHC 35.1 04/13/2021 1542   MCHC 34.3 03/14/2017 1610   RDW 13.1 04/13/2021 1542   RDW 12.5 12/07/2012 0814   LYMPHSABS 1.6 12/22/2017 0953   LYMPHSABS 0.7 (L) 12/07/2012 0814   MONOABS 0.9 03/14/2017 1610   MONOABS 0.7 12/07/2012 0814   EOSABS 0.0 12/22/2017 0953   EOSABS 0.0 12/07/2012 0814   BASOSABS 0.0 12/22/2017 0953   BASOSABS 0.0 12/07/2012 0814    Hgb A1C Lab Results  Component Value Date   HGBA1C 5.3 04/07/2020          Assessment & Plan:  Preventative Health Maintenance:  Flu shot UTD Tetanus UTD Encouraged her to get her COVID booster Zostavax UTD Discussed Shingrix vaccine, she will check coverage with her insurance company and schedule a nurse visit if she would like to have this done Pap smear UTD Mammogram and bone density ordered-she will call to schedule She declines colon screening via colonoscopy or cologuard Encouraged her to consume a balanced diet and exercise regimen Advised her to see an eye doctor and dentist annually We will check CBC, c-Met, lipid profile today  RTC in 6 months, follow-up chronic conditions Webb Silversmith, NP

## 2022-06-15 NOTE — Patient Instructions (Signed)
Health Maintenance for Postmenopausal Women Menopause is a normal process in which your ability to get pregnant comes to an end. This process happens slowly over many months or years, usually between the ages of 48 and 55. Menopause is complete when you have missed your menstrual period for 12 months. It is important to talk with your health care provider about some of the most common conditions that affect women after menopause (postmenopausal women). These include heart disease, cancer, and bone loss (osteoporosis). Adopting a healthy lifestyle and getting preventive care can help to promote your health and wellness. The actions you take can also lower your chances of developing some of these common conditions. What are the signs and symptoms of menopause? During menopause, you may have the following symptoms: Hot flashes. These can be moderate or severe. Night sweats. Decrease in sex drive. Mood swings. Headaches. Tiredness (fatigue). Irritability. Memory problems. Problems falling asleep or staying asleep. Talk with your health care provider about treatment options for your symptoms. Do I need hormone replacement therapy? Hormone replacement therapy is effective in treating symptoms that are caused by menopause, such as hot flashes and night sweats. Hormone replacement carries certain risks, especially as you become older. If you are thinking about using estrogen or estrogen with progestin, discuss the benefits and risks with your health care provider. How can I reduce my risk for heart disease and stroke? The risk of heart disease, heart attack, and stroke increases as you age. One of the causes may be a change in the body's hormones during menopause. This can affect how your body uses dietary fats, triglycerides, and cholesterol. Heart attack and stroke are medical emergencies. There are many things that you can do to help prevent heart disease and stroke. Watch your blood pressure High  blood pressure causes heart disease and increases the risk of stroke. This is more likely to develop in people who have high blood pressure readings or are overweight. Have your blood pressure checked: Every 3-5 years if you are 18-39 years of age. Every year if you are 40 years old or older. Eat a healthy diet  Eat a diet that includes plenty of vegetables, fruits, low-fat dairy products, and lean protein. Do not eat a lot of foods that are high in solid fats, added sugars, or sodium. Get regular exercise Get regular exercise. This is one of the most important things you can do for your health. Most adults should: Try to exercise for at least 150 minutes each week. The exercise should increase your heart rate and make you sweat (moderate-intensity exercise). Try to do strengthening exercises at least twice each week. Do these in addition to the moderate-intensity exercise. Spend less time sitting. Even light physical activity can be beneficial. Other tips Work with your health care provider to achieve or maintain a healthy weight. Do not use any products that contain nicotine or tobacco. These products include cigarettes, chewing tobacco, and vaping devices, such as e-cigarettes. If you need help quitting, ask your health care provider. Know your numbers. Ask your health care provider to check your cholesterol and your blood sugar (glucose). Continue to have your blood tested as directed by your health care provider. Do I need screening for cancer? Depending on your health history and family history, you may need to have cancer screenings at different stages of your life. This may include screening for: Breast cancer. Cervical cancer. Lung cancer. Colorectal cancer. What is my risk for osteoporosis? After menopause, you may be   at increased risk for osteoporosis. Osteoporosis is a condition in which bone destruction happens more quickly than new bone creation. To help prevent osteoporosis or  the bone fractures that can happen because of osteoporosis, you may take the following actions: If you are 19-50 years old, get at least 1,000 mg of calcium and at least 600 international units (IU) of vitamin D per day. If you are older than age 50 but younger than age 70, get at least 1,200 mg of calcium and at least 600 international units (IU) of vitamin D per day. If you are older than age 70, get at least 1,200 mg of calcium and at least 800 international units (IU) of vitamin D per day. Smoking and drinking excessive alcohol increase the risk of osteoporosis. Eat foods that are rich in calcium and vitamin D, and do weight-bearing exercises several times each week as directed by your health care provider. How does menopause affect my mental health? Depression may occur at any age, but it is more common as you become older. Common symptoms of depression include: Feeling depressed. Changes in sleep patterns. Changes in appetite or eating patterns. Feeling an overall lack of motivation or enjoyment of activities that you previously enjoyed. Frequent crying spells. Talk with your health care provider if you think that you are experiencing any of these symptoms. General instructions See your health care provider for regular wellness exams and vaccines. This may include: Scheduling regular health, dental, and eye exams. Getting and maintaining your vaccines. These include: Influenza vaccine. Get this vaccine each year before the flu season begins. Pneumonia vaccine. Shingles vaccine. Tetanus, diphtheria, and pertussis (Tdap) booster vaccine. Your health care provider may also recommend other immunizations. Tell your health care provider if you have ever been abused or do not feel safe at home. Summary Menopause is a normal process in which your ability to get pregnant comes to an end. This condition causes hot flashes, night sweats, decreased interest in sex, mood swings, headaches, or lack  of sleep. Treatment for this condition may include hormone replacement therapy. Take actions to keep yourself healthy, including exercising regularly, eating a healthy diet, watching your weight, and checking your blood pressure and blood sugar levels. Get screened for cancer and depression. Make sure that you are up to date with all your vaccines. This information is not intended to replace advice given to you by your health care provider. Make sure you discuss any questions you have with your health care provider. Document Revised: 01/05/2021 Document Reviewed: 01/05/2021 Elsevier Patient Education  2023 Elsevier Inc.  

## 2022-07-01 ENCOUNTER — Other Ambulatory Visit: Payer: Self-pay | Admitting: Internal Medicine

## 2022-07-01 DIAGNOSIS — I10 Essential (primary) hypertension: Secondary | ICD-10-CM

## 2022-07-02 NOTE — Telephone Encounter (Signed)
Requested Prescriptions  Pending Prescriptions Disp Refills   lisinopril (ZESTRIL) 20 MG tablet [Pharmacy Med Name: Lisinopril 20 MG Oral Tablet] 90 tablet 0    Sig: TAKE 1 TABLET BY MOUTH DAILY     Cardiovascular:  ACE Inhibitors Failed - 07/01/2022 10:07 PM      Failed - Cr in normal range and within 180 days    Creatinine, Ser  Date Value Ref Range Status  04/13/2021 0.86 0.57 - 1.00 mg/dL Final         Failed - K in normal range and within 180 days    Potassium  Date Value Ref Range Status  04/13/2021 4.1 3.5 - 5.2 mmol/L Final         Passed - Patient is not pregnant      Passed - Last BP in normal range    BP Readings from Last 1 Encounters:  06/15/22 126/84         Passed - Valid encounter within last 6 months    Recent Outpatient Visits           2 weeks ago Encounter for screening mammogram for malignant neoplasm of breast   [place], NP   1 year ago Encounter for general adult medical examination with abnormal findings   [facility] [place], Coralie Keens, NP

## 2022-07-14 ENCOUNTER — Ambulatory Visit
Admission: RE | Admit: 2022-07-14 | Discharge: 2022-07-14 | Disposition: A | Discharge status: Home / Self Care | Payer: 59 | Source: Ambulatory Visit | Attending: Internal Medicine | Admitting: Internal Medicine

## 2022-07-14 DIAGNOSIS — Z1231 Encounter for screening mammogram for malignant neoplasm of breast: Secondary | ICD-10-CM | POA: Diagnosis present

## 2022-09-03 ENCOUNTER — Other Ambulatory Visit: Payer: Self-pay | Admitting: Internal Medicine

## 2022-09-03 DIAGNOSIS — I10 Essential (primary) hypertension: Secondary | ICD-10-CM

## 2022-09-06 NOTE — Telephone Encounter (Signed)
Requested medication (s) are due for refill today:   Yes  Requested medication (s) are on the active medication list:   Yes  Future visit scheduled:   No   Last ordered: 07/02/2022 #90, 0 refills  Returned because Cr. And Potassium due per protocol.      Requested Prescriptions  Pending Prescriptions Disp Refills   lisinopril (ZESTRIL) 20 MG tablet [Pharmacy Med Name: Lisinopril 20 MG Oral Tablet] 90 tablet 3    Sig: TAKE 1 TABLET BY MOUTH DAILY     Cardiovascular:  ACE Inhibitors Failed - 09/03/2022 11:30 PM      Failed - Cr in normal range and within 180 days    Creatinine, Ser  Date Value Ref Range Status  04/13/2021 0.86 0.57 - 1.00 mg/dL Final         Failed - K in normal range and within 180 days    Potassium  Date Value Ref Range Status  04/13/2021 4.1 3.5 - 5.2 mmol/L Final         Passed - Patient is not pregnant      Passed - Last BP in normal range    BP Readings from Last 1 Encounters:  06/15/22 126/84         Passed - Valid encounter within last 6 months    Recent Outpatient Visits           2 months ago Encounter for screening mammogram for malignant neoplasm of breast   [place], NP   1 year ago Encounter for general adult medical examination with abnormal findings   [facility] [place], Coralie Keens, NP

## 2023-01-31 ENCOUNTER — Other Ambulatory Visit: Payer: Self-pay | Admitting: Internal Medicine

## 2023-01-31 DIAGNOSIS — I10 Essential (primary) hypertension: Secondary | ICD-10-CM

## 2023-02-01 ENCOUNTER — Other Ambulatory Visit: Payer: Self-pay | Admitting: Internal Medicine

## 2023-02-01 DIAGNOSIS — I10 Essential (primary) hypertension: Secondary | ICD-10-CM

## 2023-02-01 NOTE — Telephone Encounter (Signed)
Requested medications are due for refill today.  yes  Requested medications are on the active medications list.  yes  Last refill. 09/06/2022 #90 1 rf  Future visit scheduled.   no  Notes to clinic.  Labs are expired    Requested Prescriptions  Pending Prescriptions Disp Refills   lisinopril (ZESTRIL) 20 MG tablet 90 tablet 1    Sig: Take 1 tablet (20 mg total) by mouth daily.     Cardiovascular:  ACE Inhibitors Failed - 02/01/2023  4:16 PM      Failed - Cr in normal range and within 180 days    Creatinine, Ser  Date Value Ref Range Status  04/13/2021 0.86 0.57 - 1.00 mg/dL Final         Failed - K in normal range and within 180 days    Potassium  Date Value Ref Range Status  04/13/2021 4.1 3.5 - 5.2 mmol/L Final         Failed - Valid encounter within last 6 months    Recent Outpatient Visits           7 months ago Encounter for screening mammogram for malignant neoplasm of breast   [facility] [place], Salvadore [place], NP   1 year ago Encounter for general adult medical examination with abnormal findings   [facility] [place], Salvadore [place], [place]              Passed - Patient is not pregnant      Passed - Last BP in normal range    BP Readings from Last 1 Encounters:  06/15/22 126/84

## 2023-02-01 NOTE — Telephone Encounter (Signed)
Medication Refill - Medication: lisinopril (ZESTRIL) 20 MG tablet  Has the patient contacted their pharmacy? Yes.   Pt told to contact provider  Preferred Pharmacy (with phone number or street name):  [facility] [place],  - [address] [place] Phone: [phone]  Fax: [phone]     Has the patient been seen for an appointment in the last year OR does the patient have an upcoming appointment? Yes.    Agent: Please be advised that RX refills may take up to 3 business days. We ask that you follow-up with your pharmacy.

## 2023-02-01 NOTE — Telephone Encounter (Signed)
Requested medication (s) are due for refill today: Yes  Requested medication (s) are on the active medication list: Yes  Last refill:  09/06/22  Future visit scheduled: No  Notes to clinic:  Pt. States she sees PCP yearly. Declines to make appointment.    Requested Prescriptions  Pending Prescriptions Disp Refills   lisinopril (ZESTRIL) 20 MG tablet [Pharmacy Med Name: Lisinopril 20 MG Oral Tablet] 90 tablet 3    Sig: TAKE 1 TABLET BY MOUTH DAILY     Cardiovascular:  ACE Inhibitors Failed - 01/31/2023 11:23 PM      Failed - Cr in normal range and within 180 days    Creatinine, Ser  Date Value Ref Range Status  04/13/2021 0.86 0.57 - 1.00 mg/dL Final         Failed - K in normal range and within 180 days    Potassium  Date Value Ref Range Status  04/13/2021 4.1 3.5 - 5.2 mmol/L Final         Failed - Valid encounter within last 6 months    Recent Outpatient Visits           7 months ago Encounter for screening mammogram for malignant neoplasm of breast   [place] [facility] [place], Salvadore [place], NP   1 year ago Encounter for general adult medical examination with abnormal findings    [facility] [place], Salvadore Oxford, [place]              Passed - Patient is not pregnant      Passed - Last BP in normal range    BP Readings from Last 1 Encounters:  06/15/22 126/84

## 2023-03-30 ENCOUNTER — Other Ambulatory Visit: Payer: Self-pay | Admitting: Internal Medicine

## 2023-03-30 DIAGNOSIS — I10 Essential (primary) hypertension: Secondary | ICD-10-CM

## 2023-03-30 NOTE — Telephone Encounter (Signed)
Medication Refill - Medication: lisinopril (ZESTRIL) 20 MG tablet   Has the patient contacted their pharmacy? Yes.    Preferred Pharmacy (with phone number or street name):  [facility]) [place], [place] - [address] [place] Phone: [phone]  Fax: [phone]     Has the patient been seen for an appointment in the last year OR does the patient have an upcoming appointment? Yes.    Agent: Please be advised that RX refills may take up to 3 business days. We ask that you follow-up with your pharmacy.  Patient stated [facility] sent her a message stating they never got the order back in June and patient is about to run out of medication

## 2023-03-31 MED ORDER — LISINOPRIL 20 MG PO TABS
20.0000 mg | ORAL_TABLET | Freq: Every day | ORAL | 0 refills | Status: DC
Start: 2023-03-31 — End: 2023-05-26

## 2023-03-31 NOTE — Telephone Encounter (Signed)
Requested Prescriptions  Pending Prescriptions Disp Refills   lisinopril (ZESTRIL) 20 MG tablet 90 tablet 0    Sig: Take 1 tablet (20 mg total) by mouth daily.     Cardiovascular:  ACE Inhibitors Failed - 03/30/2023 11:10 AM      Failed - Cr in normal range and within 180 days    Creatinine, Ser  Date Value Ref Range Status  04/13/2021 0.86 0.57 - 1.00 mg/dL Final         Failed - K in normal range and within 180 days    Potassium  Date Value Ref Range Status  04/13/2021 4.1 3.5 - 5.2 mmol/L Final         Failed - Valid encounter within last 6 months    Recent Outpatient Visits           9 months ago Encounter for screening mammogram for malignant neoplasm of breast   [place] [facility] [place], Salvadore [place], NP   1 year ago Encounter for general adult medical examination with abnormal findings   [facility] [place], Salvadore Oxford, [place]              Passed - Patient is not pregnant      Passed - Last BP in normal range    BP Readings from Last 1 Encounters:  06/15/22 126/84

## 2023-05-25 ENCOUNTER — Other Ambulatory Visit: Payer: Self-pay | Admitting: Internal Medicine

## 2023-05-25 DIAGNOSIS — I10 Essential (primary) hypertension: Secondary | ICD-10-CM

## 2023-05-26 NOTE — Telephone Encounter (Signed)
Requested Prescriptions  Pending Prescriptions Disp Refills   lisinopril (ZESTRIL) 20 MG tablet [Pharmacy Med Name: Lisinopril 20 MG Oral Tablet] 90 tablet 0    Sig: TAKE 1 TABLET BY MOUTH DAILY     Cardiovascular:  ACE Inhibitors Failed - 05/25/2023 10:13 PM      Failed - Cr in normal range and within 180 days    Creatinine, Ser  Date Value Ref Range Status  04/13/2021 0.86 0.57 - 1.00 mg/dL Final         Failed - K in normal range and within 180 days    Potassium  Date Value Ref Range Status  04/13/2021 4.1 3.5 - 5.2 mmol/L Final         Failed - Valid encounter within last 6 months    Recent Outpatient Visits           11 months ago Encounter for screening mammogram for malignant neoplasm of breast   [facility] [place], Salvadore [place], NP   2 years ago Encounter for general adult medical examination with abnormal findings   [facility] [place], Salvadore [place], NP       Future Appointments             In 4 weeks [place], Salvadore Oxford, NP [facility]            Passed - Patient is not pregnant      Passed - Last BP in normal range    BP Readings from Last 1 Encounters:  06/15/22 126/84

## 2023-06-23 ENCOUNTER — Encounter: Payer: 59 | Admitting: Internal Medicine

## 2023-07-05 ENCOUNTER — Encounter: Payer: Self-pay | Admitting: Internal Medicine

## 2023-07-08 ENCOUNTER — Ambulatory Visit (INDEPENDENT_AMBULATORY_CARE_PROVIDER_SITE_OTHER): Payer: 59 | Admitting: Internal Medicine

## 2023-07-08 VITALS — BP 128/78 | Ht 65.5 in | Wt 148.0 lb

## 2023-07-08 DIAGNOSIS — Z1231 Encounter for screening mammogram for malignant neoplasm of breast: Secondary | ICD-10-CM | POA: Diagnosis not present

## 2023-07-08 MED ORDER — LISINOPRIL 40 MG PO TABS: 40.0000 mg | ORAL_TABLET | Freq: Every day | ORAL | 1 refills | Status: DC

## 2023-07-08 NOTE — Patient Instructions (Signed)
Health Maintenance for Postmenopausal Women Menopause is a normal process in which your ability to get pregnant comes to an end. This process happens slowly over many months or years, usually between the ages of 48 and 55. Menopause is complete when you have missed your menstrual period for 12 months. It is important to talk with your health care provider about some of the most common conditions that affect women after menopause (postmenopausal women). These include heart disease, cancer, and bone loss (osteoporosis). Adopting a healthy lifestyle and getting preventive care can help to promote your health and wellness. The actions you take can also lower your chances of developing some of these common conditions. What are the signs and symptoms of menopause? During menopause, you may have the following symptoms: Hot flashes. These can be moderate or severe. Night sweats. Decrease in sex drive. Mood swings. Headaches. Tiredness (fatigue). Irritability. Memory problems. Problems falling asleep or staying asleep. Talk with your health care provider about treatment options for your symptoms. Do I need hormone replacement therapy? Hormone replacement therapy is effective in treating symptoms that are caused by menopause, such as hot flashes and night sweats. Hormone replacement carries certain risks, especially as you become older. If you are thinking about using estrogen or estrogen with progestin, discuss the benefits and risks with your health care provider. How can I reduce my risk for heart disease and stroke? The risk of heart disease, heart attack, and stroke increases as you age. One of the causes may be a change in the body's hormones during menopause. This can affect how your body uses dietary fats, triglycerides, and cholesterol. Heart attack and stroke are medical emergencies. There are many things that you can do to help prevent heart disease and stroke. Watch your blood pressure High  blood pressure causes heart disease and increases the risk of stroke. This is more likely to develop in people who have high blood pressure readings or are overweight. Have your blood pressure checked: Every 3-5 years if you are 18-39 years of age. Every year if you are 40 years old or older. Eat a healthy diet  Eat a diet that includes plenty of vegetables, fruits, low-fat dairy products, and lean protein. Do not eat a lot of foods that are high in solid fats, added sugars, or sodium. Get regular exercise Get regular exercise. This is one of the most important things you can do for your health. Most adults should: Try to exercise for at least 150 minutes each week. The exercise should increase your heart rate and make you sweat (moderate-intensity exercise). Try to do strengthening exercises at least twice each week. Do these in addition to the moderate-intensity exercise. Spend less time sitting. Even light physical activity can be beneficial. Other tips Work with your health care provider to achieve or maintain a healthy weight. Do not use any products that contain nicotine or tobacco. These products include cigarettes, chewing tobacco, and vaping devices, such as e-cigarettes. If you need help quitting, ask your health care provider. Know your numbers. Ask your health care provider to check your cholesterol and your blood sugar (glucose). Continue to have your blood tested as directed by your health care provider. Do I need screening for cancer? Depending on your health history and family history, you may need to have cancer screenings at different stages of your life. This may include screening for: Breast cancer. Cervical cancer. Lung cancer. Colorectal cancer. What is my risk for osteoporosis? After menopause, you may be   at increased risk for osteoporosis. Osteoporosis is a condition in which bone destruction happens more quickly than new bone creation. To help prevent osteoporosis or  the bone fractures that can happen because of osteoporosis, you may take the following actions: If you are 19-50 years old, get at least 1,000 mg of calcium and at least 600 international units (IU) of vitamin D per day. If you are older than age 50 but younger than age 70, get at least 1,200 mg of calcium and at least 600 international units (IU) of vitamin D per day. If you are older than age 70, get at least 1,200 mg of calcium and at least 800 international units (IU) of vitamin D per day. Smoking and drinking excessive alcohol increase the risk of osteoporosis. Eat foods that are rich in calcium and vitamin D, and do weight-bearing exercises several times each week as directed by your health care provider. How does menopause affect my mental health? Depression may occur at any age, but it is more common as you become older. Common symptoms of depression include: Feeling depressed. Changes in sleep patterns. Changes in appetite or eating patterns. Feeling an overall lack of motivation or enjoyment of activities that you previously enjoyed. Frequent crying spells. Talk with your health care provider if you think that you are experiencing any of these symptoms. General instructions See your health care provider for regular wellness exams and vaccines. This may include: Scheduling regular health, dental, and eye exams. Getting and maintaining your vaccines. These include: Influenza vaccine. Get this vaccine each year before the flu season begins. Pneumonia vaccine. Shingles vaccine. Tetanus, diphtheria, and pertussis (Tdap) booster vaccine. Your health care provider may also recommend other immunizations. Tell your health care provider if you have ever been abused or do not feel safe at home. Summary Menopause is a normal process in which your ability to get pregnant comes to an end. This condition causes hot flashes, night sweats, decreased interest in sex, mood swings, headaches, or lack  of sleep. Treatment for this condition may include hormone replacement therapy. Take actions to keep yourself healthy, including exercising regularly, eating a healthy diet, watching your weight, and checking your blood pressure and blood sugar levels. Get screened for cancer and depression. Make sure that you are up to date with all your vaccines. This information is not intended to replace advice given to you by your health care provider. Make sure you discuss any questions you have with your health care provider. Document Revised: 01/05/2021 Document Reviewed: 01/05/2021 Elsevier Patient Education  2024 Elsevier Inc.  

## 2023-07-08 NOTE — Progress Notes (Signed)
Subjective:    Patient ID: Jane Gonzales, female    DOB: 1960-09-13, 62 y.o.   MRN: [id number]  HPI  Patient presents to clinic today for her annual exam.  Flu: 05/2023 Tetanus: 03/2018 COVID: X 3 Zostavax: 02/2015 Shingrix: Never Pap smear: 03/2021 Mammogram: 06/2022 Bone density: 04/2017 Colon screening: never Vision screening: as needed Dentist: biannually  Diet: She does eat meat. She consumes fruits and veggies. She tries to avoid fried foods. She drinks mostly water.  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      Calcium Carbonate (CALCIUM 600 PO) Take by mouth.     lisinopril (ZESTRIL) 20 MG tablet TAKE 1 TABLET BY MOUTH DAILY 90 tablet 0   sodium chloride (OCEAN) 0.65 % nasal spray Place into the nose.     No current facility-administered medications for this visit.    No Known Allergies  Family History  Problem Relation Age of Onset   Hyperlipidemia Father    Heart disease Father    Hypertension Father    Stroke Paternal Grandmother    Cancer Neg Hx    Breast cancer Neg Hx     Social History   Socioeconomic History   Marital status: Married    Spouse name: Not on file   Number of children: Not on file   Years of education: Not on file   Highest education level: Not on file  Occupational History   Not on file  Tobacco Use   Smoking status: Every Day    Current packs/day: 0.50    Average packs/day: 0.5 packs/day for 28.0 years  (14.0 ttl pk-yrs)    Types: Cigarettes   Smokeless tobacco: Never  Vaping Use   Vaping status: Never Used  Substance and Sexual Activity   Alcohol use: Yes    Alcohol/week: 6.0 standard drinks of alcohol    Types: 6 Glasses of wine per week    Comment: beer or wine---occasional   Drug use: No   Sexual activity: Yes  Other Topics Concern   Not on file  Social History Narrative   Not on file   Social Determinants of Health   Financial Resource Strain: Not on file  Food Insecurity: Not on file  Transportation Needs: Not on file  Physical Activity: Not on file  Stress: Not on file  Social Connections: Not on file  Intimate Partner Violence: Not on file     Constitutional: Denies fever, malaise, fatigue, headache or abrupt weight changes.  HEENT: Denies eye pain, eye redness, ear pain, ringing in the ears, wax buildup, runny nose, nasal congestion, bloody nose, or sore throat. Respiratory: Denies difficulty breathing, shortness of breath, cough or sputum production.   Cardiovascular: Denies chest pain, chest tightness, palpitations or swelling in the hands or feet.  Gastrointestinal: Denies abdominal pain, bloating, constipation, diarrhea or blood in the stool.  GU: Denies urgency, frequency, pain with urination, burning sensation,  blood in urine, odor or discharge. Musculoskeletal: Denies decrease in range of motion, difficulty with gait, muscle pain or joint pain and swelling.  Skin: Denies redness, rashes, lesions or ulcercations.  Neurological: Denies dizziness, difficulty with memory, difficulty with speech or problems with balance and coordination.  Psych: Denies anxiety, depression, SI/HI.  No other specific complaints in a complete review of systems (except as listed in HPI above).  Objective:   Physical Exam   BP 128/78   Ht 5' 5.5" (1.664 m)   Wt 148 lb (67.1 kg)   LMP 05/20/2015   BMI 24.25 kg/m   Wt Readings from Last 3 Encounters:  06/15/22 151 lb (68.5  kg)  04/13/21 150 lb (68 kg)  04/07/20 145 lb (65.8 kg)    General: Appears her stated age, well developed, well nourished in NAD. Skin: Warm, dry and intact.  HEENT: Head: normal shape and size; Eyes: sclera white, no icterus, conjunctiva pink, PERRLA and EOMs intact;  Neck:  Neck supple, trachea midline. No masses, lumps or thyromegaly present.  Cardiovascular: Normal rate and rhythm. S1,S2 noted.  No murmur, rubs or gallops noted. No JVD or BLE edema. No carotid bruits noted. Pulmonary/Chest: Normal effort and positive vesicular breath sounds. No respiratory distress. No wheezes, rales or ronchi noted.  Abdomen: Soft and nontender. Normal bowel sounds.  Musculoskeletal: Strength 5/5 BUE/BLE.  No difficulty with gait.  Neurological: Alert and oriented. Cranial nerves II-XII grossly intact. Coordination normal.  Psychiatric: Mood and affect normal. Behavior is normal. Judgment and thought content normal.     BMET    Component Value Date/Time   NA 144 04/13/2021 1542   K 4.1 04/13/2021 1542   CL 103 04/13/2021 1542   CO2 25 04/13/2021 1542   GLUCOSE 105 (H) 04/13/2021 1542   GLUCOSE 104 (H) 03/14/2017 1610   BUN 9 04/13/2021 1542   CREATININE 0.86 04/13/2021 1542   CALCIUM 10.7 (H) 04/13/2021 1542   GFRNONAA 85 04/07/2020 1619   GFRAA 98 04/07/2020 1619    Lipid Panel     Component Value Date/Time   CHOL 224 (H) 04/13/2021 1542   TRIG 203 (H) 04/13/2021 1542   HDL 47 04/13/2021 1542   CHOLHDL 4.8 (H) 04/13/2021 1542   LDLCALC 140 (H) 04/13/2021 1542    CBC    Component Value Date/Time   WBC 8.1 04/13/2021 1542   WBC 7.4 03/14/2017 1610   RBC 5.20 04/13/2021 1542   RBC 3.98 03/14/2017 1610   HGB 17.7 (H) 04/13/2021 1542   HCT 50.4 (H) 04/13/2021 1542   PLT 327 04/13/2021 1542   MCV 97 04/13/2021 1542   MCV 100 12/07/2012 0814   MCH 34.0 (H) 04/13/2021 1542   MCH 33.4 12/07/2012 0814   MCHC 35.1 04/13/2021 1542   MCHC 34.3 03/14/2017 1610   RDW 13.1  04/13/2021 1542   RDW 12.5 12/07/2012 0814   LYMPHSABS 1.6 12/22/2017 0953   LYMPHSABS 0.7 (L) 12/07/2012 0814   MONOABS 0.9 03/14/2017 1610   MONOABS 0.7 12/07/2012 0814   EOSABS 0.0 12/22/2017 0953   EOSABS 0.0 12/07/2012 0814   BASOSABS 0.0 12/22/2017 0953   BASOSABS 0.0 12/07/2012 0814    Hgb A1C Lab Results  Component Value Date   HGBA1C 5.3 04/07/2020           Assessment & Plan:   Preventative health maintenance:  Flu shot UTD Tetanus UTD Encouraged her to get her COVID booster Zostavax UTD Discussed Shingrix vaccine, she will check coverage  with her insurance company and schedule visit if she would like to have this done Pap smear UTD Mammogram-she will call to schedule She declines bone density She declines referral to GI for colon screening or Cologuard at this time Encouraged her to consume a balanced diet and exercise regimen Advised her to see an eye doctor and dentist annually Recent labs reviewed  RTC in 6 months, follow-up chronic conditions Nicki Reaper, NP

## 2023-11-22 ENCOUNTER — Other Ambulatory Visit: Payer: Self-pay | Admitting: Internal Medicine

## 2023-11-24 NOTE — Telephone Encounter (Signed)
 Requested Prescriptions  Pending Prescriptions Disp Refills   lisinopril (ZESTRIL) 40 MG tablet [Pharmacy Med Name: Lisinopril 40 MG Oral Tablet] 90 tablet 0    Sig: TAKE 1 TABLET BY MOUTH DAILY     Cardiovascular:  ACE Inhibitors Failed - 11/24/2023 12:14 PM      Failed - Cr in normal range and within 180 days    Creatinine, Ser  Date Value Ref Range Status  04/13/2021 0.86 0.57 - 1.00 mg/dL Final         Failed - K in normal range and within 180 days    Potassium  Date Value Ref Range Status  04/13/2021 4.1 3.5 - 5.2 mmol/L Final         Failed - Valid encounter within last 6 months    Recent Outpatient Visits   None            Passed - Patient is not pregnant      Passed - Last BP in normal range    BP Readings from Last 1 Encounters:  07/08/23 128/78

## 2023-12-12 ENCOUNTER — Ambulatory Visit
Admission: RE | Admit: 2023-12-12 | Discharge: 2023-12-12 | Disposition: A | Discharge status: Home / Self Care | Source: Ambulatory Visit | Attending: Internal Medicine | Admitting: Internal Medicine

## 2023-12-12 DIAGNOSIS — Z1231 Encounter for screening mammogram for malignant neoplasm of breast: Secondary | ICD-10-CM | POA: Insufficient documentation

## 2024-01-29 ENCOUNTER — Other Ambulatory Visit: Payer: Self-pay | Admitting: Internal Medicine

## 2024-01-31 ENCOUNTER — Telehealth: Payer: Self-pay

## 2024-01-31 NOTE — Telephone Encounter (Signed)
 Requested medications are due for refill today.  yes  Requested medications are on the active medications list.  yes  Last refill. #90 0 rf 11/24/2023  Future visit scheduled.   no  Notes to clinic.  Labs are expired.    Requested Prescriptions  Pending Prescriptions Disp Refills   lisinopril  (ZESTRIL ) 40 MG tablet [Pharmacy Med Name: Lisinopril  40 MG Oral Tablet] 90 tablet 3    Sig: TAKE 1 TABLET BY MOUTH DAILY     Cardiovascular:  ACE Inhibitors Failed - 01/31/2024  9:19 AM      Failed - Cr in normal range and within 180 days    Creatinine, Ser  Date Value Ref Range Status  04/13/2021 0.86 0.57 - 1.00 mg/dL Final         Failed - K in normal range and within 180 days    Potassium  Date Value Ref Range Status  04/13/2021 4.1 3.5 - 5.2 mmol/L Final         Failed - Valid encounter within last 6 months    Recent Outpatient Visits   None            Passed - Patient is not pregnant      Passed - Last BP in normal range    BP Readings from Last 1 Encounters:  07/08/23 128/78

## 2024-01-31 NOTE — Telephone Encounter (Signed)
 Copied from CRM [id number]. Topic: Clinical - Prescription Issue >> Jan 31, 2024 10:10 AM Stanly Early wrote: Reason for CRM: lisinopril  (ZESTRIL ) 40 MG tablet , patient prescription was denied because its waiting for approval from provider. Patient stated she needs her blood pressure medication  [facility] - [place], [place] - [address] [place] [address] Phone: [phone]  Fax: [phone]

## 2024-01-31 NOTE — Telephone Encounter (Signed)
 Advised patient that her refill request was denied earlier - needs to schedule 6 month follow up per Regina's last note, and should have medication through 6/27. Patient did not want to schedule an appointment. She questioned having to come in every 6 months, "once a year isn't good enough?" I advised that Leward Record likes to do 6 month follow up's to check in on medications, labs, BP, etc. Will call back to schedule.

## 2024-04-18 ENCOUNTER — Encounter: Payer: Self-pay | Admitting: Internal Medicine

## 2024-04-20 ENCOUNTER — Ambulatory Visit: Admitting: Internal Medicine

## 2024-04-25 ENCOUNTER — Ambulatory Visit: Admitting: Internal Medicine

## 2024-04-25 VITALS — BP 124/84 | Ht 65.5 in | Wt 143.0 lb

## 2024-04-25 DIAGNOSIS — K219 Gastro-esophageal reflux disease without esophagitis: Secondary | ICD-10-CM | POA: Diagnosis not present

## 2024-04-25 DIAGNOSIS — I10 Essential (primary) hypertension: Secondary | ICD-10-CM

## 2024-04-25 DIAGNOSIS — E782 Mixed hyperlipidemia: Secondary | ICD-10-CM

## 2024-04-25 DIAGNOSIS — D751 Secondary polycythemia: Secondary | ICD-10-CM | POA: Insufficient documentation

## 2024-04-25 DIAGNOSIS — Z853 Personal history of malignant neoplasm of breast: Secondary | ICD-10-CM | POA: Diagnosis not present

## 2024-04-25 DIAGNOSIS — K582 Mixed irritable bowel syndrome: Secondary | ICD-10-CM

## 2024-04-25 DIAGNOSIS — K589 Irritable bowel syndrome without diarrhea: Secondary | ICD-10-CM | POA: Insufficient documentation

## 2024-04-25 NOTE — Assessment & Plan Note (Signed)
 Avoid eating late at night Okay to continue tums OTC as needed

## 2024-04-25 NOTE — Assessment & Plan Note (Signed)
 Controlled on lisinopril  40 mg daily Reinforced DASH diet and exercise weight loss She declines CMET today

## 2024-04-25 NOTE — Assessment & Plan Note (Signed)
 Encouraged low fodmap diet Ok to take mirilax or imodium OTC as needed

## 2024-04-25 NOTE — Assessment & Plan Note (Signed)
 Encouraged her to consume a low-fat diet Lipid profile reviewed

## 2024-04-25 NOTE — Progress Notes (Signed)
 Subjective:    Patient ID: Jane Gonzales, female    DOB: [birth year], 63 y.o.   MRN: [id number]  HPI  Patient presents the clinic today for follow-up chronic conditions.  HTN: Her BP today is 124/84.  She is taking lisinopril  as prescribed.  There is no ECG on file.  History of breast cancer: Status post lumpectomy, and radiation.  She is getting yearly mammograms.  She follows with oncology.  HLD: Her last LDL was 121, triglycerides 180, 03/2024.  She is not taking any cholesterol-lowering medication at this time.  She tries to consume a low-fat diet.  GERD: Triggered by eating late at night.  She takes tums OTC as needed with some relief of symptoms.  There is no upper GI on file.  Polycythemia: Her last H/H was 17.7/50.4, 03/2021.  She does smoke.  She does not follow with hematology  IBS: She reports alternating constipation and diarrhea. She is not taking any medications for this. She has not seen followed with GI.  Review of Systems     Past Medical History:  Diagnosis Date  . 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 Chicken pox   . Kidney stones 2013  . Mammographic microcalcification 2013  . Personal history of radiation therapy 2014   BREAST CA  . Pregnancy examination or test, pregnancy unconfirmed 2014  . Shingles June 2015    Current Outpatient Medications  Medication Sig Dispense Refill  . lisinopril  (ZESTRIL ) 40 MG tablet TAKE 1 TABLET BY MOUTH DAILY 90 tablet 0   No current facility-administered medications for this visit.    No Known Allergies  Family History  Problem Relation Age of Onset  . Hyperlipidemia Father   . Heart disease Father   . Hypertension Father   . Stroke Paternal Grandmother   . Cancer Neg Hx   . Breast cancer Neg Hx     Social History   Socioeconomic History  . Marital status: Married    Spouse name: Not on file  . Number of children: Not on file  . Years  of education: Not on file  . Highest education level: Bachelor's degree (e.g., BA, AB, BS)  Occupational History  . Not on file  Tobacco Use  . Smoking status: Every Day    Current packs/day: 0.50    Average packs/day: 0.5 packs/day for 28.0 years (14.0 ttl pk-yrs)    Types: Cigarettes  . Smokeless tobacco: Never  Vaping Use  . Vaping status: Never Used  Substance and Sexual Activity  . Alcohol use: Yes    Alcohol/week: 6.0 standard drinks of alcohol    Types: 6 Glasses of wine per week    Comment: beer or wine---occasional  . Drug use: No  . Sexual activity: Yes  Other Topics Concern  . Not on file  Social History Narrative  . Not on file   Social Drivers of Health   Financial Resource Strain: Low Risk  (04/25/2024)   Overall Financial Resource Strain (CARDIA)   . Difficulty of Paying Living Expenses: Not hard at all  Food Insecurity: No Food Insecurity (04/25/2024)   Hunger Vital Sign   . Worried About Programme researcher, broadcasting/film/video in the Last Year: Never true   . Ran Out of Food in the Last Year: Never true  Transportation Needs: No Transportation Needs (04/25/2024)   PRAPARE - Transportation   . Lack of Transportation (Medical): No   .  Lack of Transportation (Non-Medical): No  Physical Activity: Sufficiently Active (04/25/2024)   Exercise Vital Sign   . Days of Exercise per Week: 5 days   . Minutes of Exercise per Session: 30 min  Stress: No Stress Concern Present (04/25/2024)   [employer] of Occupational Health - Occupational Stress Questionnaire   . Feeling of Stress: Only a little  Social Connections: Unknown (04/25/2024)   Social Connection and Isolation Panel   . Frequency of Communication with Friends and Family: More than three times a week   . Frequency of Social Gatherings with Friends and Family: Patient declined   . Attends Religious Services: Patient declined   . Active Member of Clubs or Organizations: Patient declined   . Attends Banker  Meetings: Not on file   . Marital Status: Divorced  Catering manager Violence: Not on file     Constitutional: Denies fever, malaise, fatigue, headache or abrupt weight changes.  HEENT: Denies eye pain, eye redness, ear pain, ringing in the ears, wax buildup, runny nose, nasal congestion, bloody nose, or sore throat. Respiratory: Denies difficulty breathing, shortness of breath, cough or sputum production.   Cardiovascular: Denies chest pain, chest tightness, palpitations or swelling in the hands or feet.  Gastrointestinal: Patient reports intermittent reflux, alternating constipation and diarrhea.  Denies abdominal pain, bloating, or blood in the stool.  GU: Denies urgency, frequency, pain with urination, burning sensation, blood in urine, odor or discharge. Musculoskeletal: Denies decrease in range of motion, difficulty with gait, muscle pain or joint pain and swelling.  Skin: Denies redness, rashes, lesions or ulcercations.  Neurological: Denies dizziness, difficulty with memory, difficulty with speech or problems with balance and coordination.  Psych: Denies anxiety, depression, SI/HI.  No other specific complaints in a complete review of systems (except as listed in HPI above).  Objective:   Physical Exam BP 124/84 (BP Location: Left Arm, Patient Position: Sitting, Cuff Size: Normal)   Ht 5' 5.5 (1.664 m)   Wt 143 lb (64.9 kg)   LMP 05/20/2015   BMI 23.43 kg/m   Wt Readings from Last 3 Encounters:  07/08/23 148 lb (67.1 kg)  06/15/22 151 lb (68.5 kg)  04/13/21 150 lb (68 kg)    General: Appears her stated age, well developed, well nourished in NAD. Skin: Warm, dry and intact. No rashes noted. HEENT: Head: normal shape and size; Eyes: sclera white and EOMs intact;  Cardiovascular: Normal rate and rhythm. S1,S2 noted.  No murmur, rubs or gallops noted. No JVD or BLE edema. No carotid bruits noted. Pulmonary/Chest: Normal effort and positive vesicular breath sounds. No  respiratory distress. No wheezes, rales or ronchi noted.  Abdomen: Normal bowel sounds.  Musculoskeletal:  No difficulty with gait.  Neurological: Alert and oriented. Coordination normal.  Psychiatric: Mood and affect normal. Behavior is normal. Judgment and thought content normal.     BMET    Component Value Date/Time   NA 144 04/13/2021 1542   K 4.1 04/13/2021 1542   CL 103 04/13/2021 1542   CO2 25 04/13/2021 1542   GLUCOSE 105 (H) 04/13/2021 1542   GLUCOSE 104 (H) 03/14/2017 1610   BUN 9 04/13/2021 1542   CREATININE 0.86 04/13/2021 1542   CALCIUM 10.7 (H) 04/13/2021 1542   GFRNONAA 85 04/07/2020 1619   GFRAA 98 04/07/2020 1619    Lipid Panel     Component Value Date/Time   CHOL 224 (H) 04/13/2021 1542   TRIG 203 (H) 04/13/2021 1542   HDL 47 04/13/2021  1542   CHOLHDL 4.8 (H) 04/13/2021 1542   LDLCALC 140 (H) 04/13/2021 1542    CBC    Component Value Date/Time   WBC 8.1 04/13/2021 1542   WBC 7.4 03/14/2017 1610   RBC 5.20 04/13/2021 1542   RBC 3.98 03/14/2017 1610   HGB 17.7 (H) 04/13/2021 1542   HCT 50.4 (H) 04/13/2021 1542   PLT 327 04/13/2021 1542   MCV 97 04/13/2021 1542   MCV 100 12/07/2012 0814   MCH 34.0 (H) 04/13/2021 1542   MCH 33.4 12/07/2012 0814   MCHC 35.1 04/13/2021 1542   MCHC 34.3 03/14/2017 1610   RDW 13.1 04/13/2021 1542   RDW 12.5 12/07/2012 0814   LYMPHSABS 1.6 12/22/2017 0953   LYMPHSABS 0.7 (L) 12/07/2012 0814   MONOABS 0.9 03/14/2017 1610   MONOABS 0.7 12/07/2012 0814   EOSABS 0.0 12/22/2017 0953   EOSABS 0.0 12/07/2012 0814   BASOSABS 0.0 12/22/2017 0953   BASOSABS 0.0 12/07/2012 0814    Hgb A1C Lab Results  Component Value Date   HGBA1C 5.3 04/07/2020            Assessment & Plan:     RTC in 6 months for your annual exam Angeline Laura, NP

## 2024-04-25 NOTE — Assessment & Plan Note (Signed)
In remission She will continue to get yearly mammograms

## 2024-04-25 NOTE — Assessment & Plan Note (Signed)
 She declines CBC today  Encouraged smoking cessation If unable to quit smoking would recommend monthly blood donation

## 2024-04-25 NOTE — Patient Instructions (Signed)
 Polycythemia Vera  Polycythemia vera (PV) is a form of blood cancer called a myeloproliferative neoplasm (MPN) in which the bone marrow makes too many red blood cells. The bone marrow may also make too many clotting cells (platelets) and white blood cells. Bone marrow is the spongy center of bones where blood cells are produced. Sometimes, this causes an overproduction of blood cells in the liver and spleen, causing those organs to become enlarged. Additionally, people who have PV are at a higher risk for stroke or heart attack because their blood may clot more easily. PV is a Cominsky-term, or chronic, disease. What are the causes? Almost all people who have PV have an abnormal gene (genetic mutation) that causes changes in the way that the bone marrow makes blood cells. This gene, which is called JAK2, is not hereditary. This means that it is not passed along from parent to child. It is not known what triggers the genetic mutation that causes the body to produce too many red blood cells. What increases the risk? You are more likely to develop this condition if: You are female. You are 70 years of age or older. What are the signs or symptoms? You may not have any symptoms in the early stage of PV. When symptoms develop, they may include: Itchy skin, especially after bathing. Burning sensation in the hands or feet. Abdominal fullness or feeling full soon after eating. Ulcers on fingers or toes. Shortness of breath. Bone or joint pain. Dizziness. Headache. Tiredness. Ringing in the ears. Weight loss. Unusual bleeding, including nosebleeds or bleeding from gums. How is this diagnosed? This condition may be diagnosed during a routine physical exam and a blood test called a complete blood count (CBC). Your health care provider may also suspect PV if you have symptoms. During the physical exam, your health care provider may find that you have an enlarged liver or spleen. You may also have tests to  confirm the diagnosis. These may include: A procedure to remove a sample of bone marrow for testing (bone marrow biopsy). Blood tests to check for: The JAK2 gene. Low levels of a hormone that helps to regulate red blood cell production (erythropoietin). How is this treated? There is no cure for PV, but treatment can help to control the disease. There are several types of treatment. No single treatment works for everyone. You will need to work with a blood cancer specialist (hematologist) to find the treatment that is best for you. This condition may be treated by: Periodically having some blood removed with a needle (phlebotomy) to lower the number of red blood cells. Taking medicine. Your health care provider may recommend: Low-dose aspirin to lower your risk for blood clots. A medicine to control blood counts. A medicine that slows down the effects of JAK2 gene. Other medicines to treat symptoms such as itching. Follow these instructions at home:  Take over-the-counter and prescription medicines only as told by your health care provider. Do regular exercise as told by your health care provider. Check your hands and feet regularly for any sores that do not heal. Do not use any products that contain nicotine or tobacco. These products include cigarettes, chewing tobacco, and vaping devices, such as e-cigarettes. If you need help quitting, ask your health care provider. Return to your normal activities as told by your health care provider. Ask your health care provider what activities are safe for you. Keep all follow-up visits. This is important. Contact a health care provider if: You have  side effects from your medicines. Your symptoms change or get worse at home. You have blood in your stool or you vomit blood. Get help right away if: You have sudden and severe pain in your abdomen. You have chest pain or difficulty breathing. You have any symptoms of a stroke. BE FAST is an easy way  to remember the main warning signs of a stroke: B - Balance. Signs are dizziness, sudden trouble walking, or loss of balance. E - Eyes. Signs are trouble seeing or a sudden change in vision. F - Face. Signs are sudden weakness or numbness of the face, or the face or eyelid drooping on one side. A - Arms. Signs are weakness or numbness in an arm. This happens suddenly and usually on one side of the body. S - Speech. Signs are sudden trouble speaking, slurred speech, or trouble understanding what people say. T - Time. Time to call emergency services. Write down what time symptoms started. You have other signs of a stroke, such as: A sudden, severe headache with no known cause. Nausea or vomiting. Seizure. These symptoms may be an emergency. Get help right away. Call 911. Do not wait to see if the symptoms will go away. Do not drive yourself to the hospital. Summary Polycythemia vera is a form of blood cancer in which the bone marrow makes too many red blood cells. People who have polycythemia vera are at a higher risk for stroke or heart attack because their blood may clot more easily. The disease is most often associated with an abnormal gene (genetic mutation) that causes changes in the way that the bone marrow makes blood cells. There is no cure for PV, but treatment can help to control the disease. It is treated by periodically having some blood removed and by taking medicines. This information is not intended to replace advice given to you by your health care provider. Make sure you discuss any questions you have with your health care provider. Document Revised: 08/11/2021 Document Reviewed: 08/11/2021 Elsevier Patient Education  2025 [employer].

## 2024-05-07 ENCOUNTER — Other Ambulatory Visit: Payer: Self-pay | Admitting: Internal Medicine

## 2024-05-08 NOTE — Telephone Encounter (Signed)
 Requested medications are due for refill today.  yes  Requested medications are on the active medications list.  yes  Last refill. 11/24/2023 #90 0 rf  Future visit scheduled.   no  Notes to clinic.  Labs are expired.    Requested Prescriptions  Pending Prescriptions Disp Refills   lisinopril  (ZESTRIL ) 40 MG tablet [Pharmacy Med Name: Lisinopril  40 MG Oral Tablet] 90 tablet 3    Sig: TAKE 1 TABLET BY MOUTH DAILY     Cardiovascular:  ACE Inhibitors Failed - 05/08/2024  2:30 PM      Failed - Cr in normal range and within 180 days    Creatinine, Ser  Date Value Ref Range Status  04/13/2021 0.86 0.57 - 1.00 mg/dL Final         Failed - K in normal range and within 180 days    Potassium  Date Value Ref Range Status  04/13/2021 4.1 3.5 - 5.2 mmol/L Final         Passed - Patient is not pregnant      Passed - Last BP in normal range    BP Readings from Last 1 Encounters:  04/25/24 124/84         Passed - Valid encounter within last 6 months    Recent Outpatient Visits           1 week ago Essential hypertension   [place] [facility] [place], Angeline ORN, NP
